# Patient Record
Sex: Female | Born: 1970 | Race: Black or African American | Hispanic: No | Marital: Married | Smoking: Current every day smoker
Health system: Southern US, Community
[De-identification: ages and names within clinical notes are randomized; demographics above are authoritative.]

## PROBLEM LIST (undated history)

## (undated) DIAGNOSIS — J45909 Unspecified asthma, uncomplicated: Secondary | ICD-10-CM

## (undated) DIAGNOSIS — L309 Dermatitis, unspecified: Secondary | ICD-10-CM

## (undated) HISTORY — PX: TUBAL LIGATION: SHX77

## (undated) HISTORY — PX: INJECTION, BULKING AGENT, URETHRA: SHX7315

---

## 2000-06-16 ENCOUNTER — Emergency Department (HOSPITAL_COMMUNITY): Admission: EM | Admit: 2000-06-16 | Discharge: 2000-06-16 | Payer: Self-pay | Admitting: Emergency Medicine

## 2003-03-24 ENCOUNTER — Emergency Department (HOSPITAL_COMMUNITY): Admission: EM | Admit: 2003-03-24 | Discharge: 2003-03-24 | Payer: Self-pay | Admitting: Emergency Medicine

## 2004-07-18 ENCOUNTER — Inpatient Hospital Stay (HOSPITAL_COMMUNITY): Admission: AD | Admit: 2004-07-18 | Discharge: 2004-07-21 | Payer: Self-pay | Admitting: Obstetrics

## 2004-07-19 ENCOUNTER — Encounter (INDEPENDENT_AMBULATORY_CARE_PROVIDER_SITE_OTHER): Payer: Self-pay | Admitting: Specialist

## 2004-07-25 ENCOUNTER — Inpatient Hospital Stay (HOSPITAL_COMMUNITY): Admission: AD | Admit: 2004-07-25 | Discharge: 2004-07-28 | Payer: Self-pay | Admitting: Obstetrics

## 2004-09-26 ENCOUNTER — Ambulatory Visit (HOSPITAL_BASED_OUTPATIENT_CLINIC_OR_DEPARTMENT_OTHER): Admission: RE | Admit: 2004-09-26 | Discharge: 2004-09-26 | Payer: Self-pay | Admitting: Cardiology

## 2006-08-10 ENCOUNTER — Emergency Department (HOSPITAL_COMMUNITY): Admission: EM | Admit: 2006-08-10 | Discharge: 2006-08-10 | Payer: Self-pay | Admitting: Emergency Medicine

## 2008-03-27 ENCOUNTER — Emergency Department (HOSPITAL_COMMUNITY): Admission: EM | Admit: 2008-03-27 | Discharge: 2008-03-27 | Payer: Self-pay | Admitting: Family Medicine

## 2010-07-01 ENCOUNTER — Ambulatory Visit (HOSPITAL_COMMUNITY): Admission: RE | Admit: 2010-07-01 | Discharge: 2010-07-01 | Payer: Self-pay | Admitting: Obstetrics

## 2011-02-24 NOTE — Discharge Summary (Signed)
Kara Ramsey, Kara Ramsey                 ACCOUNT NO.:  [id number]   MEDICAL RECORD NO.:  [id number]          PATIENT TYPE:  INP   LOCATION:  9110                          FACILITY:  [facility]   PHYSICIAN:  Kathreen Cosier, M.D.DATE OF BIRTH:  30-Jan-1971   DATE OF ADMISSION:  07/25/2004  DATE OF DISCHARGE:  07/28/2004                                 DISCHARGE SUMMARY   HOSPITAL COURSE:  The patient is a 40 year old gravida 5 para 3-0-2-3 with a  normal vaginal delivery on July 19, 2004 and tubal ligation.  The patient  had no history of increased blood pressures.  However, when a visiting nurse  went to her house on the day of admission her blood pressure was 200/105.  She had a 2-day history of headache.  On admission, her hemoglobin was 15.3,  platelets 251, sodium 136, potassium 3.8, chloride 102, creatinine 0.6, uric  acid was 5.6.  The patient was started on magnesium sulfate, 4 g loading, 2  g per hour.  She also had 2+ edema.  By October 18, blood pressure ranged  between 138/80 to 156/100.  On October 19, diastolics 100 to 111, uric acid  5.9, liver enzymes normal.  Magnesium sulfate was discontinued and she was  started on labetalol 200 mg p.o. b.i.d.  She was also on hydrochlorothiazide  50 mg p.o. daily and Norvasc 10 mg daily.  Her blood pressures subsequently  normalized and she was discharged home on October 20 on hydrochlorothiazide  50 mg p.o. daily, labetalol 200 mg p.o. b.i.d., Norvasc 10 mg p.o. daily, to  see me on Monday after discharge at 10 a.m. for blood pressure check.   DISCHARGE DIAGNOSIS:  Delayed preeclampsia postpartum.  To see me in 3 days  for blood pressure check.      BAM/MEDQ  D:  08/24/2004  T:  08/24/2004  Job:  [id number]

## 2011-02-24 NOTE — Discharge Summary (Signed)
NAMEHARUNA, Kara Ramsey                 ACCOUNT NO.:  [id number]   MEDICAL RECORD NO.:  [id number]          PATIENT TYPE:  INP   LOCATION:  9124                          FACILITY:  [facility]   PHYSICIAN:  Kathreen Cosier, M.D.DATE OF BIRTH:  11-13-1970   DATE OF ADMISSION:  07/18/2004  DATE OF DISCHARGE:                                 DISCHARGE SUMMARY   The patient is a 40 year old, gravida 7, para 2-0-4-2, [facility] July 30, 2004.  She was admitted in labor, she has a positive GBS, she got penicillin on  admission.  On admission, her cervix was 4 cm, 90%, vertex zero station. She  progressed satisfactorily in labor and had a normal vaginal delivery of a  female Apgar 9 & 9, no episiotomy or laceration, baby weighed 7 pounds, 2  ounces.  The patient desired sterilization and on October 11 underwent  postpartum tubal ligation. She did well and was discharged on the second  postpartum day ambulatory on a regular diet on Tylenol #3 to see me in six  weeks.   LABORATORY DATA:  On admission hemoglobin 13.5, platelets 220, white count  8.2 and postpartum hemoglobin 12.7, platelets 185.   DISCHARGE DIAGNOSES:  Status post normal vaginal delivery at term and  postpartum tubal ligation.      BAM/MEDQ  D:  07/21/2004  T:  07/21/2004  Job:  [id number]

## 2011-02-24 NOTE — Procedures (Signed)
NAMESABIRIN, Kara Ramsey                 ACCOUNT NO.:  [id number]   MEDICAL RECORD NO.:  [id number]          PATIENT TYPE:  OUT   LOCATION:  SLEEP CENTER                 FACILITY:  [facility]   PHYSICIAN:  Clinton D. Maple Hudson, M.D. DATE OF BIRTH:  14-Jun-1971   DATE OF STUDY:  09/26/2004                              NOCTURNAL POLYSOMNOGRAM   STUDY DATE:  September 26, 2004   REFERRING PHYSICIAN:  Osvaldo Shipper. Spruill, M.D.   INDICATION FOR STUDY:  Insomnia with sleep apnea.   EPWORTH SLEEPINESS SCORE:  15/24   NECK SIZE:  15 inches   BODY MASS INDEX:  30.4   WEIGHT:  183 pounds   MEDICATIONS:  Home medications include Zoloft and Ambien which may affect  sleep.   SLEEP ARCHITECTURE:  Total sleep time 383 minutes with sleep efficiency 89%.  Stage I was 4%, stage II 64%, stages III and IV were absent, REM was 31% of  total sleep time.  Sleep latency 40 minutes, REM latency 56 minutes, awake  after sleep onset 9.5 minutes, arousal index 30.   RESPIRATORY DATA:  RDI 4.4/hr which is within the upper limits of normal for  frequency of obstructive events and does not indicate sleep-disordered  breathing.  There was one obstructive apnea and 27 hypopneas.  Events were  not positional.  REM RDI was 1/hr.   OXYGEN DATA:  Moderate to loud snoring with oxygen desaturation to a nadir  of 87% with events.  Mean oxygen saturation through the study was 91-93% on  room air.   CARDIAC DATA:  Normal sinus rhythm with occasional PVC and PAC.   MOVEMENT/PARASOMNIA:  A total of 65 limb jerks were recorded of which 7 were  associated with arousal or awakening for a periodic limb movement with  arousal index of 1.1/hr which is unremarkable.   IMPRESSION/RECOMMENDATION:  Occasional obstructive respiratory events were  noted but they did not meet the diagnostic criteria to be labeled as sleep-  disordered breathing with an respiratory disturbance index of 4.4/hr.  There was mild oxygen desaturation to 87% briefly  with events.  Best therapy  may be directed at her insomnia complaint with attention to weight loss and  good sleep hygiene.                                                           Clinton D. Maple Hudson, M.D.  Diplomate, [employer] Board   CDY/MEDQ  D:  10/02/2004 12:50:31  T:  10/02/2004 21:51:22  Job:  [id number]

## 2011-02-24 NOTE — Op Note (Signed)
NAMELAUREN, Kara Ramsey                 ACCOUNT NO.:  [id number]   MEDICAL RECORD NO.:  [id number]          PATIENT TYPE:  INP   LOCATION:  9124                          FACILITY:  [facility]   PHYSICIAN:  Kathreen Cosier, M.D.DATE OF BIRTH:  03/02/1971   DATE OF PROCEDURE:  07/19/2004  DATE OF DISCHARGE:                                 OPERATIVE REPORT   PREOPERATIVE DIAGNOSES:  Multiparity.   POSTOPERATIVE DIAGNOSES:  Multiparity.   PROCEDURE:  Postpartum tubal ligation.   Using epidural, patient in supine position, abdomen prepped and draped,  bladder emptied with a straight catheter. A midline subumbilical incision  one inch long was made, carried down to the fascia. The fascia cleaned, the  fascia and the peritoneum opened with Mayo scissors.  The left tube brought  out to the mid portion with a Babcock clamp. The tube traced to the fimbria.  A #0 plain suture placed in the mesosalpinx with lower portion of tube in  the clamp. This was tied and approximately 1 inch of tube transected.  Hemostasis satisfactory. The procedure done in the exact fashion on the  other side. Lap and sponge counts correct, abdomen closed in layers.  Peritoneum and fascia continuous suture of #0 Dexon and the skin closed with  subcuticular stitch of 4-0 Monocryl. Blood loss less than 5 mL.  The patient  tolerated the procedure well and taken to the recovery room in good  condition.      BAM/MEDQ  D:  07/19/2004  T:  07/19/2004  Job:  [id number]

## 2012-03-04 ENCOUNTER — Emergency Department (HOSPITAL_COMMUNITY)
Admission: EM | Admit: 2012-03-04 | Discharge: 2012-03-04 | Disposition: A | Discharge status: Home / Self Care | Payer: Self-pay | Attending: Emergency Medicine | Admitting: Emergency Medicine

## 2012-03-04 ENCOUNTER — Emergency Department (HOSPITAL_COMMUNITY): Payer: Self-pay

## 2012-03-04 ENCOUNTER — Encounter (HOSPITAL_COMMUNITY): Payer: Self-pay | Admitting: *Deleted

## 2012-03-04 DIAGNOSIS — R059 Cough, unspecified: Secondary | ICD-10-CM | POA: Insufficient documentation

## 2012-03-04 DIAGNOSIS — R05 Cough: Secondary | ICD-10-CM

## 2012-03-04 DIAGNOSIS — R6883 Chills (without fever): Secondary | ICD-10-CM | POA: Insufficient documentation

## 2012-03-04 DIAGNOSIS — IMO0001 Reserved for inherently not codable concepts without codable children: Secondary | ICD-10-CM | POA: Insufficient documentation

## 2012-03-04 DIAGNOSIS — R0602 Shortness of breath: Secondary | ICD-10-CM | POA: Insufficient documentation

## 2012-03-04 DIAGNOSIS — F17200 Nicotine dependence, unspecified, uncomplicated: Secondary | ICD-10-CM | POA: Insufficient documentation

## 2012-03-04 DIAGNOSIS — Z79899 Other long term (current) drug therapy: Secondary | ICD-10-CM | POA: Insufficient documentation

## 2012-03-04 DIAGNOSIS — H9209 Otalgia, unspecified ear: Secondary | ICD-10-CM | POA: Insufficient documentation

## 2012-03-04 DIAGNOSIS — J069 Acute upper respiratory infection, unspecified: Secondary | ICD-10-CM | POA: Insufficient documentation

## 2012-03-04 MED ORDER — BENZONATATE 100 MG PO CAPS: 100.0000 mg | ORAL_CAPSULE | Freq: Three times a day (TID) | ORAL | Status: AC

## 2012-03-04 MED ORDER — HYDROCOD POLST-CHLORPHEN POLST 10-8 MG/5ML PO LQCR: 5.0000 mL | Freq: Two times a day (BID) | ORAL | Status: DC

## 2012-03-04 NOTE — Discharge Instructions (Signed)
Your chest x-ray did not show any signs for concerning infection or pneumonia. At this time he your providers feel your symptoms cause from viral infection which should improve on its own in the next several days. You were given prescriptions for cough medications to help with your symptoms. Please use these to help with your symptoms. Please also followup with your primary care provider for continued evaluation and treatment. Return if you have any fever, chills, sweats or shortness of breath.   Cough, Adult  A cough is a reflex that helps clear your throat and airways. It can help heal the body or may be a reaction to an irritated airway. A cough may only last 2 or 3 weeks (acute) or may last more than 8 weeks (chronic).  CAUSES Acute cough:  Viral or bacterial infections.  Chronic cough:  Infections.   Allergies.   Asthma.   Post-nasal drip.   Smoking.   Heartburn or acid reflux.   Some medicines.   Chronic lung problems (COPD).   Cancer.  SYMPTOMS   Cough.   Fever.   Chest pain.   Increased breathing rate.   High-pitched whistling sound when breathing (wheezing).   Colored mucus that you cough up (sputum).  TREATMENT   A bacterial cough may be treated with antibiotic medicine.   A viral cough must run its course and will not respond to antibiotics.   Your caregiver may recommend other treatments if you have a chronic cough.  HOME CARE INSTRUCTIONS   Only take over-the-counter or prescription medicines for pain, discomfort, or fever as directed by your caregiver. Use cough suppressants only as directed by your caregiver.   Use a cold steam vaporizer or humidifier in your bedroom or home to help loosen secretions.   Sleep in a semi-upright position if your cough is worse at night.   Rest as needed.   Stop smoking if you smoke.  SEEK IMMEDIATE MEDICAL CARE IF:   You have pus in your sputum.   Your cough starts to worsen.   You cannot control your  cough with suppressants and are losing sleep.   You begin coughing up blood.   You have difficulty breathing.   You develop pain which is getting worse or is uncontrolled with medicine.   You have a fever.  MAKE SURE YOU:   Understand these instructions.   Will watch your condition.   Will get help right away if you are not doing well or get worse.  Document Released: 03/24/2011 Document Revised: 09/14/2011 Document Reviewed: 03/24/2011 [facility] Patient Information 2012 [place], [place].

## 2012-03-04 NOTE — ED Provider Notes (Signed)
History     CSN: [id number]  Arrival date & time 03/04/12  0419   First MD Initiated Contact with Patient 03/04/12 [phone]      Chief Complaint  Patient presents with  . Cough  . Chills    HPI  History provided by the patient. Patient is a 41 year old female with no significant past medical history who presents with complaints of persistent productive cough, generalized fatigue and body aches with occasional chills. Patient reports sputum is yellow to sometimes greenish color. She has had occasional shortness of breath with her cough symptoms. Patient reports the coughing is so severe that it is difficult to sleep. She has tried over-the-counter cough medications without significant improvement. She denies any other aggravating or alleviating factors. Patient denies any associated fever, nausea, vomiting or diarrhea. Patient has no prior history of asthma. Patient is a current smoker.    Past Medical History  Diagnosis Date  . Arthritis     History reviewed. No pertinent past surgical history.  History reviewed. No pertinent family history.  History  Substance Use Topics  . Smoking status: Current Everyday Smoker  . Smokeless tobacco: Not on file  . Alcohol Use: Yes    OB History    Grav Para Term Preterm Abortions TAB SAB Ect Mult Living                  Review of Systems  Constitutional: Positive for chills. Negative for fever.  HENT: Positive for ear pain. Negative for sore throat.   Respiratory: Positive for cough and shortness of breath.   Cardiovascular: Negative for chest pain.  Gastrointestinal: Negative for nausea, vomiting, abdominal pain and diarrhea.  Neurological: Positive for headaches.    Allergies  Review of patient's allergies indicates no known allergies.  Home Medications   Current Outpatient Rx  Name Route Sig Dispense Refill  . GUAIFENESIN ER 600 MG PO TB12 Oral Take 1,200 mg by mouth 2 (two) times daily as needed. For cough/congestion       BP 150/86  Pulse 79  Temp(Src) 97.8 F (36.6 C) (Oral)  Resp 16  SpO2 97%  Physical Exam  Nursing note and vitals reviewed. Constitutional: She is oriented to person, place, and time. She appears well-developed and well-nourished. No distress.  HENT:  Head: Normocephalic and atraumatic.  Mouth/Throat: Oropharynx is clear and moist.  Cardiovascular: Normal rate and regular rhythm.   Pulmonary/Chest: Effort normal and breath sounds normal. No respiratory distress. She has no wheezes. She has no rales.       Coughing present  Abdominal: Soft. She exhibits no distension. There is no tenderness.  Neurological: She is alert and oriented to person, place, and time.  Skin: Skin is warm and dry. No rash noted.  Psychiatric: She has a normal mood and affect. Her behavior is normal.    ED Course  Procedures   Dg Chest 2 View  03/04/2012  *RADIOLOGY REPORT*  Clinical Data: Shortness of breath, cough, chills, chest pain. Smoker.  CHEST - 2 VIEW  Comparison: None.  Findings: The heart size and pulmonary vascularity are normal. The lungs appear clear and expanded without focal air space disease or consolidation. No blunting of the costophrenic angles.  No pneumothorax.  IMPRESSION: No evidence of active pulmonary disease.  Original Report Authenticated By: Marlon Pel, M.D.     1. URI (upper respiratory infection)   2. Cough       MDM  Patient seen and evaluated. Patient in no  acute distress.        Angus Seller, [place] 03/04/12 2312

## 2012-03-04 NOTE — ED Notes (Signed)
Pt states she felt fine yesterday. Pt states that she started coughing around 22:00 last night and then had a HA, chills and ear ache. Pt states she has a productive cough.

## 2012-03-04 NOTE — ED Notes (Signed)
Rx x 2, pt voiced understanding to f/u with PCP and return for worsening conditions

## 2012-03-05 NOTE — ED Provider Notes (Signed)
Medical screening examination/treatment/procedure(s) were performed by non-physician practitioner and as supervising physician I was immediately available for consultation/collaboration.   Kayti Poss, MD 03/05/12 0414 

## 2012-10-25 ENCOUNTER — Emergency Department (INDEPENDENT_AMBULATORY_CARE_PROVIDER_SITE_OTHER)
Admission: EM | Admit: 2012-10-25 | Discharge: 2012-10-25 | Disposition: A | Discharge status: Home / Self Care | Payer: Self-pay | Source: Home / Self Care | Attending: Family Medicine | Admitting: Family Medicine

## 2012-10-25 ENCOUNTER — Encounter (HOSPITAL_COMMUNITY): Payer: Self-pay | Admitting: *Deleted

## 2012-10-25 DIAGNOSIS — L259 Unspecified contact dermatitis, unspecified cause: Secondary | ICD-10-CM

## 2012-10-25 MED ORDER — FLUTICASONE PROPIONATE 0.005 % EX OINT: TOPICAL_OINTMENT | Freq: Two times a day (BID) | CUTANEOUS | Status: DC

## 2012-10-25 NOTE — ED Notes (Signed)
Pt reports rash on both hands /fingers for over the past year - has periods of getting better/worse without reason or agitation

## 2012-10-25 NOTE — ED Provider Notes (Signed)
History     CSN: [id number]  Arrival date & time 10/25/12  1002   First MD Initiated Contact with Patient 10/25/12 1010      Chief Complaint  Patient presents with  . Rash    (Consider location/radiation/quality/duration/timing/severity/associated sxs/prior treatment) Patient is a 42 y.o. female presenting with rash. The history is provided by the patient.  Rash  This is a chronic problem. The current episode started more than 1 week ago (for 1 yr at least, onset related to wedding ring.). The problem has been gradually worsening. The problem is associated with an unknown factor. There has been no fever. The patient is experiencing no pain. Associated symptoms include itching. Pertinent negatives include no blisters and no pain.    Past Medical History  Diagnosis Date  . Arthritis     History reviewed. No pertinent past surgical history.  Family History  Problem Relation Age of Onset  . Family history unknown: Yes    History  Substance Use Topics  . Smoking status: Current Every Day Smoker  . Smokeless tobacco: Not on file  . Alcohol Use: Yes    OB History    Grav Para Term Preterm Abortions TAB SAB Ect Mult Living                  Review of Systems  Constitutional: Negative.   Skin: Positive for itching and rash.    Allergies  Review of patient's allergies indicates no known allergies.  Home Medications   Current Outpatient Rx  Name  Route  Sig  Dispense  Refill  . HYDROCOD POLST-CPM POLST ER 10-8 MG/5ML PO LQCR   Oral   Take 5 mLs by mouth every 12 (twelve) hours. Take 5 mLs by mouth every 12 (twelve) hours.   140 mL   0   . FLUTICASONE PROPIONATE 0.005 % EX OINT   Topical   Apply topically 2 (two) times daily.   30 g   0   . GUAIFENESIN ER 600 MG PO TB12   Oral   Take 1,200 mg by mouth 2 (two) times daily as needed. For cough/congestion           BP 126/82  Pulse 93  Temp 98.3 F (36.8 C) (Oral)  Resp 18  SpO2 99%  LMP  10/12/2012  Physical Exam  Nursing note and vitals reviewed. Constitutional: She is oriented to person, place, and time. She appears well-developed and well-nourished.  Neurological: She is alert and oriented to person, place, and time.  Skin: Skin is warm and dry. Rash noted. No erythema.       Dry peeling finger dermatitis on lrf and right thumb.    ED Course  Procedures (including critical care time)  Labs Reviewed - No data to display No results found.   1. Contact dermatitis and eczema due to cause       MDM          Linna Hoff, MD 10/25/12 1054

## 2012-12-17 ENCOUNTER — Emergency Department (HOSPITAL_COMMUNITY)
Admission: EM | Admit: 2012-12-17 | Discharge: 2012-12-17 | Disposition: A | Discharge status: Home / Self Care | Payer: Self-pay | Attending: Emergency Medicine | Admitting: Emergency Medicine

## 2012-12-17 ENCOUNTER — Encounter (HOSPITAL_COMMUNITY): Payer: Self-pay | Admitting: Emergency Medicine

## 2012-12-17 DIAGNOSIS — F17200 Nicotine dependence, unspecified, uncomplicated: Secondary | ICD-10-CM | POA: Insufficient documentation

## 2012-12-17 DIAGNOSIS — J45909 Unspecified asthma, uncomplicated: Secondary | ICD-10-CM | POA: Insufficient documentation

## 2012-12-17 DIAGNOSIS — L301 Dyshidrosis [pompholyx]: Secondary | ICD-10-CM | POA: Insufficient documentation

## 2012-12-17 HISTORY — DX: Unspecified asthma, uncomplicated: J45.909

## 2012-12-17 MED ORDER — PREDNISONE 10 MG PO TABS: ORAL_TABLET | ORAL | Status: DC

## 2012-12-17 MED ORDER — TRAMADOL HCL 50 MG PO TABS: 50.0000 mg | ORAL_TABLET | Freq: Four times a day (QID) | ORAL | Status: DC | PRN

## 2012-12-17 NOTE — ED Notes (Signed)
Pt from home, c/o persistent bilateral rash on fingers. Was seen at urgent care 1 month ago for same.

## 2012-12-17 NOTE — ED Provider Notes (Signed)
Medical screening examination/treatment/procedure(s) were performed by non-physician practitioner and as supervising physician I was immediately available for consultation/collaboration.   Alan Davidson III, MD 12/17/12 1948 

## 2012-12-17 NOTE — ED Provider Notes (Signed)
History     CSN: [id number]  Arrival date & time 12/17/12  4132   First MD Initiated Contact with Patient 12/17/12 [phone]      Chief Complaint  Patient presents with  . Rash    (Consider location/radiation/quality/duration/timing/severity/associated sxs/prior treatment) Patient is a 42 y.o. female presenting with rash. The history is provided by the patient. No language interpreter was used.  Rash Location:  Hand Hand rash location:  L hand and R hand Quality: blistering and painful   Associated symptoms: no fever   Associated symptoms comment:  Rash that has been recurrent on both hands, including fingers and palms/dorsum, for the past year. It becomes painful. No drainage. No recognized pattern.    Past Medical History  Diagnosis Date  . Asthma     Past Surgical History  Procedure Laterality Date  . Tubal ligation      History reviewed. No pertinent family history.  History  Substance Use Topics  . Smoking status: Current Every Day Smoker  . Smokeless tobacco: Not on file  . Alcohol Use: Yes    OB History   Grav Para Term Preterm Abortions TAB SAB Ect Mult Living                  Review of Systems  Constitutional: Negative for fever.  Skin: Positive for rash.    Allergies  Review of patient's allergies indicates no known allergies.  Home Medications   Current Outpatient Rx  Name  Route  Sig  Dispense  Refill  . fluticasone (CUTIVATE) 0.005 % ointment   Topical   Apply topically 2 (two) times daily.   30 g   0   . hydrocortisone cream 0.5 %   Topical   Apply 1 application topically 3 (three) times daily as needed (rash).           BP 152/118  Pulse 112  Temp(Src) 98.3 F (36.8 C) (Oral)  Resp 18  SpO2 98%  Physical Exam  Constitutional: She is oriented to person, place, and time. She appears well-developed and well-nourished. No distress.  Musculoskeletal: Normal range of motion. She exhibits no edema and no tenderness.   Neurological: She is alert and oriented to person, place, and time.  Skin:  Papulovesicular rash to dorsal and palmar fingers of both hands and palms. No redness, or purulence. No swelling. Fingernails normal in appearance - no evidence of fungal invasion.    ED Course  Procedures (including critical care time)  Labs Reviewed - No data to display No results found.   No diagnosis found.  1. 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 She has tried topical steroids without improvement. Has a dermatologist appointment in place for later this month. Will give tape dose of oral steroids, supportive measures and encourage follow up.        Arnoldo Hooker, PA-C 12/17/12 [phone]

## 2013-03-01 ENCOUNTER — Emergency Department (HOSPITAL_COMMUNITY): Payer: Self-pay

## 2013-03-01 ENCOUNTER — Emergency Department (HOSPITAL_COMMUNITY)
Admission: EM | Admit: 2013-03-01 | Discharge: 2013-03-01 | Disposition: A | Discharge status: Home / Self Care | Payer: Self-pay | Attending: Emergency Medicine | Admitting: Emergency Medicine

## 2013-03-01 ENCOUNTER — Encounter (HOSPITAL_COMMUNITY): Payer: Self-pay | Admitting: Emergency Medicine

## 2013-03-01 DIAGNOSIS — N949 Unspecified condition associated with female genital organs and menstrual cycle: Secondary | ICD-10-CM | POA: Insufficient documentation

## 2013-03-01 DIAGNOSIS — M549 Dorsalgia, unspecified: Secondary | ICD-10-CM | POA: Insufficient documentation

## 2013-03-01 DIAGNOSIS — N898 Other specified noninflammatory disorders of vagina: Secondary | ICD-10-CM | POA: Insufficient documentation

## 2013-03-01 DIAGNOSIS — Z3202 Encounter for pregnancy test, result negative: Secondary | ICD-10-CM | POA: Insufficient documentation

## 2013-03-01 DIAGNOSIS — D259 Leiomyoma of uterus, unspecified: Secondary | ICD-10-CM | POA: Insufficient documentation

## 2013-03-01 DIAGNOSIS — N83202 Unspecified ovarian cyst, left side: Secondary | ICD-10-CM

## 2013-03-01 DIAGNOSIS — N938 Other specified abnormal uterine and vaginal bleeding: Secondary | ICD-10-CM | POA: Insufficient documentation

## 2013-03-01 DIAGNOSIS — N760 Acute vaginitis: Secondary | ICD-10-CM | POA: Insufficient documentation

## 2013-03-01 DIAGNOSIS — N83209 Unspecified ovarian cyst, unspecified side: Secondary | ICD-10-CM | POA: Insufficient documentation

## 2013-03-01 DIAGNOSIS — Z9851 Tubal ligation status: Secondary | ICD-10-CM | POA: Insufficient documentation

## 2013-03-01 DIAGNOSIS — F17200 Nicotine dependence, unspecified, uncomplicated: Secondary | ICD-10-CM | POA: Insufficient documentation

## 2013-03-01 DIAGNOSIS — J45909 Unspecified asthma, uncomplicated: Secondary | ICD-10-CM | POA: Insufficient documentation

## 2013-03-01 LAB — URINALYSIS, ROUTINE W REFLEX MICROSCOPIC
Bilirubin Urine: NEGATIVE
Glucose, UA: NEGATIVE mg/dL
Ketones, ur: NEGATIVE mg/dL
Protein, ur: 100 mg/dL — AB
Urobilinogen, UA: 1 mg/dL (ref 0.0–1.0)

## 2013-03-01 LAB — CBC WITH DIFFERENTIAL/PLATELET
Basophils Absolute: 0 10*3/uL (ref 0.0–0.1)
Eosinophils Absolute: 0.2 10*3/uL (ref 0.0–0.7)
Eosinophils Relative: 2 % (ref 0–5)
HCT: 44.4 % (ref 36.0–46.0)
Lymphocytes Relative: 31 % (ref 12–46)
MCH: 30.6 pg (ref 26.0–34.0)
MCHC: 35.4 g/dL (ref 30.0–36.0)
MCV: 86.5 fL (ref 78.0–100.0)
Monocytes Absolute: 0.5 10*3/uL (ref 0.1–1.0)
Platelets: 226 10*3/uL (ref 150–400)
RDW: 13 % (ref 11.5–15.5)

## 2013-03-01 LAB — COMPREHENSIVE METABOLIC PANEL
ALT: 16 U/L (ref 0–35)
AST: 14 U/L (ref 0–37)
CO2: 24 mEq/L (ref 19–32)
Calcium: 9.4 mg/dL (ref 8.4–10.5)
Creatinine, Ser: 0.83 mg/dL (ref 0.50–1.10)
GFR calc Af Amer: >90 mL/min (ref >90)
GFR calc non Af Amer: 87 mL/min — ABNORMAL LOW (ref >90)
Glucose, Bld: 90 mg/dL (ref 70–99)
Sodium: 136 mEq/L (ref 135–145)
Total Protein: 7.5 g/dL (ref 6.0–8.3)

## 2013-03-01 LAB — WET PREP, GENITAL
Trich, Wet Prep: NONE SEEN
Yeast Wet Prep HPF POC: NONE SEEN

## 2013-03-01 LAB — URINE MICROSCOPIC-ADD ON

## 2013-03-01 LAB — POCT PREGNANCY, URINE: Preg Test, Ur: NEGATIVE

## 2013-03-01 MED ORDER — METRONIDAZOLE 500 MG PO TABS: 500.0000 mg | ORAL_TABLET | Freq: Two times a day (BID) | ORAL | Status: DC

## 2013-03-01 MED ORDER — IBUPROFEN 800 MG PO TABS
800.0000 mg | ORAL_TABLET | Freq: Once | ORAL | Status: AC
Start: 2013-03-01 — End: 2013-03-01
  Administered 2013-03-01: 800 mg via ORAL
  Filled 2013-03-01: qty 1

## 2013-03-01 NOTE — ED Provider Notes (Signed)
History     CSN: [id number]  Arrival date & time 03/01/13  4098   First MD Initiated Contact with Patient 03/01/13 [phone]      Chief Complaint  Patient presents with  . Abdominal Pain  . Abdominal Cramping    (Consider location/radiation/quality/duration/timing/severity/associated sxs/prior treatment) HPI Comments: Kara Ramsey is a 42 y/o F with PMhx of asthma presenting to the ED with prolonged menstruation and abdominal cramping. Patient reported that her LMP was 02/14/2013 - stated that she was a little late this month, normal cycles begin around the 4th-5th of each month, normally lasting approximately 5-6 days. Patient stated that she has been bleeding off and on since 02/14/2013. Stated that after the normal 5-6 days of her cycle she noticed brownish blood spotting, thinking that the period was ending, but then this past Wednesday/Thursday she noticed that she was bleeding bright red blood again - went through one pad - then stated that the bleeding went back to the normal brownish blood spotting yesterday. Patient stated that she was awoken by abdominal cramping this morning at 4:00-5:00AM. Stated that the abdominal cramping began on the left side and then spread to the lower abdomen. Patient described the discomfort to be a constant cramping sensation with radiation to the lower back. Stated that her discomfort is a 5/10. Reported that she feels bloated. Patient stated that she has been experiencing breast tenderness that has been ongoing since yesterday - patient reported that these are all normal symptoms that she has when she is ovulating and when she is in the process of menstruating. Patient concerned regarding the length of bleeding and cramping that she is experiencing. Denied fever, chills, sweating, chest pain, shortness of breath, difficulty breathing, n/v/d, melena, hematochezia, weakness, changes to appetite, visual changes, urinary symptoms, vaginal discharge, vaginal pain,  dyspareunia.  [id number] - tubal ligation performed in 2005. Patient is sexually active with husband - last encounter was approximately 3 weeks ago as per patient - denied protection.  GYN: Dr. Trudie Buckler - has not seen GYN in about a year.   Patient is a 42 y.o. female presenting with abdominal pain and cramps. The history is provided by the patient. No language interpreter was used.  Abdominal Pain Pertinent negatives include no abdominal pain, arthralgias, chills, congestion, coughing, fatigue, fever, headaches, nausea, neck pain, numbness, rash, sore throat, vomiting or weakness.  Abdominal Cramping Pertinent negatives include no abdominal pain, arthralgias, chills, congestion, coughing, fatigue, fever, headaches, nausea, neck pain, numbness, rash, sore throat, vomiting or weakness.    Past Medical History  Diagnosis Date  . Asthma     Past Surgical History  Procedure Laterality Date  . Tubal ligation      History reviewed. No pertinent family history.  History  Substance Use Topics  . Smoking status: Current Every Day Smoker  . Smokeless tobacco: Not on file  . Alcohol Use: Yes    OB History   Grav Para Term Preterm Abortions TAB SAB Ect Mult Living                  Review of Systems  Constitutional: Negative for fever, chills and fatigue.  HENT: Negative for ear pain, congestion, sore throat, rhinorrhea, trouble swallowing, neck pain, neck stiffness and tinnitus.   Eyes: Negative for photophobia, pain and visual disturbance.  Respiratory: Negative for cough, chest tightness and shortness of breath.   Gastrointestinal: Negative for nausea, vomiting, abdominal pain, diarrhea, constipation and blood in stool.  Genitourinary: Positive for vaginal bleeding  and pelvic pain. Negative for dysuria, urgency, hematuria, flank pain, decreased urine volume, vaginal discharge, difficulty urinating, vaginal pain and dyspareunia.  Musculoskeletal: Positive for back pain (cramping sensation  to the back ). Negative for arthralgias.  Skin: Negative for rash and wound.  Neurological: Negative for dizziness, weakness, light-headedness, numbness and headaches.  All other systems reviewed and are negative.    Allergies  Review of patient's allergies indicates no known allergies.  Home Medications   Current Outpatient Rx  Name  Route  Sig  Dispense  Refill  . metroNIDAZOLE (FLAGYL) 500 MG tablet   Oral   Take 1 tablet (500 mg total) by mouth 2 (two) times daily. One po bid x 7 days   14 tablet   0     BP 133/95  Pulse 85  Temp(Src) 98.5 F (36.9 C) (Oral)  Resp 15  SpO2 95%  LMP 03/01/2013  Physical Exam  Nursing note and vitals reviewed. Constitutional: She is oriented to person, place, and time. She appears well-developed and well-nourished. No distress.  HENT:  Head: Normocephalic and atraumatic.  Mouth/Throat: Oropharynx is clear and moist. No oropharyngeal exudate.  Eyes: Conjunctivae and EOM are normal. Pupils are equal, round, and reactive to light. Right eye exhibits no discharge. Left eye exhibits no discharge.  Neck: Normal range of motion. Neck supple. No tracheal deviation present. No thyromegaly present.  Negative neck stiffness Negative nuchal rigidity Negative lymphadenopathy  Cardiovascular: Normal rate, regular rhythm and normal heart sounds.  Exam reveals no friction rub.   No murmur heard. Pulses:      Radial pulses are 2+ on the right side, and 2+ on the left side.       Dorsalis pedis pulses are 2+ on the right side, and 2+ on the left side.  Pulmonary/Chest: Effort normal and breath sounds normal. No respiratory distress. She has no wheezes. She has no rales.  Abdominal: Soft. Normal appearance and bowel sounds are normal. She exhibits no distension and no mass. There is tenderness (Mainly pelvic discomfort - described as a "cramping" when pressure applied). There is no rebound, no guarding, no tenderness at McBurney's point and negative  Murphy's sign.    Genitourinary: Vagina normal and uterus normal. No vaginal discharge found.  Speculum: Negative sores, lesions, inflammation, swelling, infection noted to the external genitalia. Patient has a piercing to the hood of the clitoris. Bright red vaginal bleeding noted to the vaginal vault. Negative lesions, sores, masses noted to walls of vaginal canal. Negative strawberry cervix. Positive bright red blood from cervical os. Negative lesions, sores, inflammation, plaques noted to the cervix.  Pelvic: Negative masses, lesions, sores felt upon palpation. Uterus anteroverted. Negative cervical motion tenderness. Negative pain upon palpation to the pelvic, just pressure felt as reported by patient.   Lymphadenopathy:    She has no cervical adenopathy.  Neurological: She is alert and oriented to person, place, and time. No cranial nerve deficit. She exhibits normal muscle tone. Coordination normal.  Skin: Skin is warm and dry. No rash noted. She is not diaphoretic. No erythema.  Psychiatric: She has a normal mood and affect. Her behavior is normal. Thought content normal.    ED Course  Procedures (including critical care time)  10:21Am Discussed lab findings with patient. Patient reported abdominal cramping - Ibuprofen 800 mg PO ordered   Labs Reviewed  WET PREP, GENITAL - Abnormal; Notable for the following:    Clue Cells Wet Prep HPF POC MODERATE (*)    WBC,  Wet Prep HPF POC FEW (*)    All other components within normal limits  URINALYSIS, ROUTINE W REFLEX MICROSCOPIC - Abnormal; Notable for the following:    Color, Urine AMBER (*)    APPearance TURBID (*)    Hgb urine dipstick LARGE (*)    Protein, ur 100 (*)    Leukocytes, UA SMALL (*)    All other components within normal limits  CBC WITH DIFFERENTIAL - Abnormal; Notable for the following:    RBC 5.13 (*)    Hemoglobin 15.7 (*)    All other components within normal limits  COMPREHENSIVE METABOLIC PANEL - Abnormal;  Notable for the following:    Alkaline Phosphatase 38 (*)    GFR calc non Af Amer 87 (*)    All other components within normal limits  URINE MICROSCOPIC-ADD ON - Abnormal; Notable for the following:    Squamous Epithelial / LPF FEW (*)    Bacteria, UA MANY (*)    All other components within normal limits  GC/CHLAMYDIA PROBE AMP  LIPASE, BLOOD  POCT PREGNANCY, URINE   US Transvaginal Non-ob  03/01/2013   *RADIOLOGY REPORT*  Clinical Data: 42 year old, LMP began on 02/14/2013 but the patient has had continuous bleeding since and has pelvic pain.  Prior bilateral tubal ligation.  TRANSABDOMINAL AND TRANSVAGINAL ULTRASOUND OF PELVIS  Technique:  Both transabdominal and transvaginal ultrasound examinations of the pelvis were performed. Transabdominal technique was performed for global imaging of the pelvis including uterus, ovaries, adnexal regions, and pelvic cul-de-sac.  It was necessary to proceed with endovaginal exam following the transabdominal exam to optimally visualize the endometrium and ovaries as the bladder was incompletely distended.  Comparison:  None.  Findings:  Uterus: Upper normal in size measuring approximate 9.2 x 4.8 x 5.7 cm.  Subserosal fibroid arising from the anterior fundus measuring approximately 1.4 x 1.1 x 1.2 cm.  No other visible fibroids. Normal-appearing uterine cervix.  Endometrium: Normal and trilaminar in appearance measuring 8 mm in thickness.  No endometrial fluid or mass.  Right ovary:  Normal in size and appearance measuring approximate 2.8 x 1.7 x 1.4 cm.  Normal color Doppler flow within the ovary.  Left ovary: Upper normal in size measuring approximate 4.1 x 3.1 x 3.4 cm, containing a dominant 3.5 x 2.5 x 2.3 cm simple cyst. Normal color Doppler flow within the ovary.  Other findings: No other adnexal masses or free pelvic fluid.  IMPRESSION:  1.  No abnormalities identified to explain dysfunctional uterine bleeding.  Normal-appearing endometrium measuring 8 mm in  thickness. 2.  Approximate 1.4 cm subserosal fibroid arising from the anterior uterine fundus. 3.  Approximate 3.5 cm simple cyst arising from the left ovary. This is almost certainly benign, and no specific imaging follow up is recommended according to the Society of Radiologists in Ultrasound 2010 Consensus  Conference Statement (D Lenis Noon et al. Management of Asymptomatic Ovarian and Other Adnexal Cysts Imaged at [place]:  Society of Radiologists in Ultrasound Consensus Conference Statement 2010.  Radiology 256 (Sept 2010): 943-954).   Original Report Authenticated By: Hulan Saas, M.D.   US Pelvis Complete  03/01/2013   *RADIOLOGY REPORT*  Clinical Data: 42 year old, LMP began on 02/14/2013 but the patient has had continuous bleeding since and has pelvic pain.  Prior bilateral tubal ligation.  TRANSABDOMINAL AND TRANSVAGINAL ULTRASOUND OF PELVIS  Technique:  Both transabdominal and transvaginal ultrasound examinations of the pelvis were performed. Transabdominal technique was performed for global imaging of the pelvis including uterus, ovaries,  adnexal regions, and pelvic cul-de-sac.  It was necessary to proceed with endovaginal exam following the transabdominal exam to optimally visualize the endometrium and ovaries as the bladder was incompletely distended.  Comparison:  None.  Findings:  Uterus: Upper normal in size measuring approximate 9.2 x 4.8 x 5.7 cm.  Subserosal fibroid arising from the anterior fundus measuring approximately 1.4 x 1.1 x 1.2 cm.  No other visible fibroids. Normal-appearing uterine cervix.  Endometrium: Normal and trilaminar in appearance measuring 8 mm in thickness.  No endometrial fluid or mass.  Right ovary:  Normal in size and appearance measuring approximate 2.8 x 1.7 x 1.4 cm.  Normal color Doppler flow within the ovary.  Left ovary: Upper normal in size measuring approximate 4.1 x 3.1 x 3.4 cm, containing a dominant 3.5 x 2.5 x 2.3 cm simple cyst. Normal color Doppler flow  within the ovary.  Other findings: No other adnexal masses or free pelvic fluid.  IMPRESSION:  1.  No abnormalities identified to explain dysfunctional uterine bleeding.  Normal-appearing endometrium measuring 8 mm in thickness. 2.  Approximate 1.4 cm subserosal fibroid arising from the anterior uterine fundus. 3.  Approximate 3.5 cm simple cyst arising from the left ovary. This is almost certainly benign, and no specific imaging follow up is recommended according to the Society of Radiologists in Ultrasound 2010 Consensus  Conference Statement (D Lenis Noon et al. Management of Asymptomatic Ovarian and Other Adnexal Cysts Imaged at [place]:  Society of Radiologists in Ultrasound Consensus Conference Statement 2010.  Radiology 256 (Sept 2010): 943-954).   Original Report Authenticated By: Hulan Saas, M.D.     1. Dysfunctional uterine bleeding   2. Uterine fibroid   3. Left ovarian cyst   4. BV (bacterial vaginosis)   5. Asthma       MDM  Patient is afebrile, non-tachycardic, non-tachypneic, normotensive, adequate saturation on room air, alert and oriented  DDx: Dysmennorrhea Ovarian Cyst Uterine Fibroid STD  Wet prep - moderate clue cells noted - possible BV infection UA large amount of blood, many bacteria noted CBC Hgb lowered (15.7) - patient asymptomatic CMP negative findings Lipase within normal limits (21) Transabd/transvag US of pelvis - approximate 1.4 cm subserosal fibroid arising from anterior uterine fundus. Approximate 3.5 cm simple cyst arising from the left ovary - benign, no follow-up imaging recommended  Discussed findings with patient. Dysfunctional menstrual bleeding possibly due to anterior uterine subserosal uterine fibroid. Left ovarian cyst noted on [place]. Discussed these findings with patient and the need to follow-up. Patient being treated for BV with presence of clue cells on wet prep. Patient aseptic, non-toxic appearing, in no acute distress. Negative urine pregnancy -  less likely to think ectopic pregnancy. Pain controlled in ED setting. Discharged patient with Flagyl for BV infection - discussed course. Discussed with patient to follow-up with Dr. Trudie Buckler regarding fibriods, ovarian cyst, and the need to have a pap smear performed once every year. Discussed with patient to rest and stay hydrated. Discussed with patient to continue monitoring her symptoms and if symptoms are to worsen or change to report back to the ED. Patient agreed to plan of care, understood, all questions answered.          Raymon Mutton, PA-C 03/01/13 1743

## 2013-03-01 NOTE — ED Notes (Signed)
Pt reports she normally has her menstrual cycle 4th or 5th of each month and is always regular.  Pt reports normal cycle lasts 5 days.  Pt reports "I started this period on May 9th and I am still bleeding.  This morning I had clots."

## 2013-03-01 NOTE — ED Notes (Addendum)
Pt also reports lower back pain, "it almost felt like contractions."  Back pain lasts approximately 20 minutes at a time.  No radiation noted.

## 2013-03-01 NOTE — ED Notes (Signed)
PA at bedside.

## 2013-03-01 NOTE — ED Notes (Signed)
Pt provided with crackers.

## 2013-03-01 NOTE — ED Notes (Signed)
Patient transported to Ultrasound 

## 2013-03-01 NOTE — ED Provider Notes (Signed)
Medical screening examination/treatment/procedure(s) were performed by non-physician practitioner and as supervising physician I was immediately available for consultation/collaboration.   Carleene Cooper III, MD 03/01/13 [phone]

## 2013-03-02 LAB — GC/CHLAMYDIA PROBE AMP: CT Probe RNA: NEGATIVE

## 2015-11-30 ENCOUNTER — Encounter (HOSPITAL_COMMUNITY): Payer: Self-pay

## 2015-11-30 ENCOUNTER — Emergency Department (HOSPITAL_COMMUNITY)
Admission: EM | Admit: 2015-11-30 | Discharge: 2015-11-30 | Disposition: A | Discharge status: Home / Self Care | Payer: BLUE CROSS/BLUE SHIELD | Attending: Emergency Medicine | Admitting: Emergency Medicine

## 2015-11-30 DIAGNOSIS — Y9289 Other specified places as the place of occurrence of the external cause: Secondary | ICD-10-CM | POA: Insufficient documentation

## 2015-11-30 DIAGNOSIS — Y998 Other external cause status: Secondary | ICD-10-CM | POA: Diagnosis not present

## 2015-11-30 DIAGNOSIS — T7840XA Allergy, unspecified, initial encounter: Secondary | ICD-10-CM | POA: Insufficient documentation

## 2015-11-30 DIAGNOSIS — Y9389 Activity, other specified: Secondary | ICD-10-CM | POA: Diagnosis not present

## 2015-11-30 DIAGNOSIS — J45909 Unspecified asthma, uncomplicated: Secondary | ICD-10-CM | POA: Insufficient documentation

## 2015-11-30 DIAGNOSIS — F17200 Nicotine dependence, unspecified, uncomplicated: Secondary | ICD-10-CM | POA: Insufficient documentation

## 2015-11-30 DIAGNOSIS — R21 Rash and other nonspecific skin eruption: Secondary | ICD-10-CM | POA: Diagnosis present

## 2015-11-30 DIAGNOSIS — X58XXXA Exposure to other specified factors, initial encounter: Secondary | ICD-10-CM | POA: Insufficient documentation

## 2015-11-30 LAB — URINALYSIS, ROUTINE W REFLEX MICROSCOPIC
Glucose, UA: NEGATIVE mg/dL
KETONES UR: NEGATIVE mg/dL
NITRITE: NEGATIVE
PROTEIN: 100 mg/dL — AB
SPECIFIC GRAVITY, URINE: 1.031 — AB (ref 1.005–1.030)
pH: 5.5 (ref 5.0–8.0)

## 2015-11-30 LAB — URINE MICROSCOPIC-ADD ON

## 2015-11-30 MED ORDER — PREDNISONE 20 MG PO TABS
60.0000 mg | ORAL_TABLET | Freq: Once | ORAL | Status: AC
  Administered 2015-11-30: 60 mg via ORAL
  Filled 2015-11-30: qty 3

## 2015-11-30 MED ORDER — PREDNISONE 20 MG PO TABS: ORAL_TABLET | ORAL | Status: DC

## 2015-11-30 MED ORDER — FAMOTIDINE 20 MG PO TABS: 20.0000 mg | ORAL_TABLET | Freq: Two times a day (BID) | ORAL | Status: DC

## 2015-11-30 MED ORDER — FAMOTIDINE 20 MG PO TABS
20.0000 mg | ORAL_TABLET | Freq: Once | ORAL | Status: AC
  Administered 2015-11-30: 20 mg via ORAL
  Filled 2015-11-30: qty 1

## 2015-11-30 MED ORDER — LORATADINE 10 MG PO TABS
10.0000 mg | ORAL_TABLET | Freq: Once | ORAL | Status: AC
  Administered 2015-11-30: 10 mg via ORAL
  Filled 2015-11-30: qty 1

## 2015-11-30 NOTE — ED Notes (Addendum)
Pt c/o of redness, itching and rash experienced over face, neck and face. Pt reports using a new detergent within the last couple of days. Pt denies food allergies. Pt denies new medication use.  Pt A+OX4, speaking in complete sentences. Pt denies hx of being dx'd with HTN. Pt is hypertensive in triage 161/101 sitting.

## 2015-11-30 NOTE — Discharge Instructions (Signed)
Allergy Shots Allergy shots, also called immunotherapy, are a treatment used to help reduce allergy symptoms such as:  Sneezing.  Itchy, watery eyes.  A runny, stuffy nose.  Asthma. Allergy shots may benefit people who are allergic to:  Grass, tree, and weed pollens.  Insects.  Animal dander.  Dust mites.  Molds. HOW DO ALLERGY SHOTS WORK? Allergy shots work by exposing your body to a little bit of the substance you are allergic to (allergen) at a time. They allow your body to become familiar with the allergen and create proteins called antibodies, which block the effects of the allergen. Allergy shots begin to work shortly after you begin treatment, but allergy symptoms may not improve for 4-6 months.  HOW OFTEN AND FOR HOW LONG WILL I NEED SHOTS? Most people start by getting shots 1-3 times a week for 3-6 months, and then continue to get maintenance shots about once a month for life. Some people can stop getting shots after 3-5 years.  Allergy shots may be discontinued if:  The shots do not work for you.  You start taking certain medicines such as ACE inhibitors or beta blockers.  You miss many appointments for your shots.  You do not follow the instructions given to you by your health care provider. WHAT ARE THE SIDE EFFECTS OF ALLERGY SHOTS? The most common side effect is mild redness and swelling where the shot was given. The redness and swelling goes away on its own. Less common side effects are:  Itchy eyes, nose, or throat.  Sneezing.  Runny nose.  Itchy, red, swollen areas of skin (hives).  Trouble breathing.  Coughing.  Wheezing.  Scratchy throat.  Tightness in the chest.  Nausea.  Dizziness. After getting an allergy shot, you will need to stay at the clinic for up to 30 minutes so that a health care provider can be sure you do not have serious side effects.   This information is not intended to replace advice given to you by your health care  provider. Make sure you discuss any questions you have with your health care provider.   Document Released: 07/04/2008 Document Revised: 10/16/2014 Document Reviewed: 07/07/2014 Elsevier Interactive Patient Education 2016 [employer]  

## 2015-11-30 NOTE — ED Provider Notes (Signed)
CSN: [id number]     Arrival date & time 11/30/15  0037 History  By signing my name below, I, Altamease Oiler, attest that this documentation has been prepared under the direction and in the presence of Jaleah Lefevre, MD. Electronically Signed: Altamease Oiler, ED Scribe. 11/30/2015. 2:28 AM   Chief Complaint  Patient presents with  . Allergic Reaction  . Hypertension   Patient is a 45 y.o. female presenting with rash. The history is provided by the patient. No language interpreter was used.  Rash Location:  Head/neck and torso Torso rash location:  L chest and R chest Quality: itchiness   Severity:  Moderate Onset quality:  Gradual Duration:  1 day Timing:  Constant Progression:  Worsening Chronicity:  New Context: new detergent/soap   Relieved by:  Nothing Worsened by:  Nothing tried Ineffective treatments:  Antihistamines Associated symptoms: no fever, no nausea, no shortness of breath, no sore throat, no throat swelling, no tongue swelling and not wheezing    Kara Ramsey is a 45 y.o. female who presents to the Emergency Department complaining of a worsening pruritic rash at the face, neck, posterior scalp, and chest with onset yesterday. She associates her symptoms with recently switching to ALL detergent. Pt denies food allergies and new medication use. Benadryl, last dose approximately 4.5 hours ago, provided insufficient relief in itching at home. Pt denies difficulty breathing or swallowing.   Past Medical History  Diagnosis Date  . Asthma    Past Surgical History  Procedure Laterality Date  . Tubal ligation     History reviewed. No pertinent family history. Social History  Substance Use Topics  . Smoking status: Current Every Day Smoker  . Smokeless tobacco: None  . Alcohol Use: Yes   OB History    No data available     Review of Systems  Constitutional: Negative for fever.  HENT: Negative for sore throat and trouble swallowing.   Respiratory: Negative  for shortness of breath and wheezing.   Gastrointestinal: Negative for nausea.  Skin: Positive for rash.  All other systems reviewed and are negative.  Allergies  Review of patient's allergies indicates no known allergies.  Home Medications   Prior to Admission medications   Medication Sig Start Date End Date Taking? Authorizing Provider  metroNIDAZOLE (FLAGYL) 500 MG tablet Take 1 tablet (500 mg total) by mouth 2 (two) times daily. One po bid x 7 days 03/01/13   Marissa Sciacca, PA-C   BP 161/101 mmHg  Pulse 102  Temp(Src) 97.6 F (36.4 C) (Oral)  Resp 16  SpO2 97%  LMP 11/30/2015 (LMP Unknown) Physical Exam  Constitutional: She is oriented to person, place, and time. She appears well-developed and well-nourished.  HENT:  Head: Normocephalic and atraumatic.  Mouth/Throat: Oropharynx is clear and moist.  Moist mucous membranes No exudate No swelling of the lips, tongue, or uvula  Eyes: Pupils are equal, round, and reactive to light.  Neck: Normal range of motion. Neck supple.  Trachea midline  Cardiovascular: Normal rate, regular rhythm and intact distal pulses.   Pulmonary/Chest: Effort normal and breath sounds normal. No stridor. She has no wheezes. She has no rales.  CTAB  Abdominal: Soft. Bowel sounds are normal. She exhibits no mass. There is no tenderness. There is no rebound and no guarding.  Musculoskeletal: Normal range of motion.  Neurological: She is alert and oriented to person, place, and time. She has normal reflexes.  Skin: Skin is warm and dry. Rash noted.  Few wheels on  the upper chest. Nothing at the hairline.   Psychiatric: She has a normal mood and affect. Her behavior is normal.  Nursing note and vitals reviewed.   ED Course  Procedures (including critical care time) DIAGNOSTIC STUDIES: Oxygen Saturation is 97% on RA,  normal by my interpretation.    COORDINATION OF CARE: 2:23 AM Discussed treatment plan which includes Claritin and Pepcid with pt  at bedside and pt agreed to plan.  Labs Review Labs Reviewed  URINALYSIS, ROUTINE W REFLEX MICROSCOPIC (NOT AT [facility]) - Abnormal; Notable for the following:    Color, Urine RED (*)    APPearance CLOUDY (*)    Specific Gravity, Urine 1.031 (*)    Hgb urine dipstick LARGE (*)    Bilirubin Urine MODERATE (*)    Protein, ur 100 (*)    Leukocytes, UA SMALL (*)    All other components within normal limits  URINE MICROSCOPIC-ADD ON - Abnormal; Notable for the following:    Squamous Epithelial / LPF 6-30 (*)    Bacteria, UA MANY (*)    All other components within normal limits    Imaging Review No results found. I have personally reviewed and evaluated these lab results as part of my medical decision-making.   EKG Interpretation None      MDM   Final diagnoses:  None    Medications - No data to display  Will start steroids and pepcid BID; continue benadryl q6 HRS AND REWASH ALL CLOTHING.  STRICT RETURN PRECAUTIONS GIVEN.  FOLLOW UP WITH YOUR PMD FOR RECHECK I personally performed the services described in this documentation, which was scribed in my presence. The recorded information has been reviewed and is accurate.      Veatrice Kells, MD 11/30/15 [phone]

## 2015-12-01 LAB — URINE CULTURE: SPECIAL REQUESTS: NORMAL

## 2018-02-24 ENCOUNTER — Emergency Department (HOSPITAL_COMMUNITY)
Admission: EM | Admit: 2018-02-24 | Discharge: 2018-02-24 | Disposition: A | Discharge status: Home / Self Care | Payer: BLUE CROSS/BLUE SHIELD | Attending: Emergency Medicine | Admitting: Emergency Medicine

## 2018-02-24 ENCOUNTER — Other Ambulatory Visit: Payer: Self-pay

## 2018-02-24 ENCOUNTER — Encounter (HOSPITAL_COMMUNITY): Payer: Self-pay | Admitting: Emergency Medicine

## 2018-02-24 DIAGNOSIS — J45909 Unspecified asthma, uncomplicated: Secondary | ICD-10-CM | POA: Diagnosis not present

## 2018-02-24 DIAGNOSIS — I10 Essential (primary) hypertension: Secondary | ICD-10-CM

## 2018-02-24 DIAGNOSIS — F17200 Nicotine dependence, unspecified, uncomplicated: Secondary | ICD-10-CM | POA: Insufficient documentation

## 2018-02-24 DIAGNOSIS — Z79899 Other long term (current) drug therapy: Secondary | ICD-10-CM | POA: Insufficient documentation

## 2018-02-24 DIAGNOSIS — R21 Rash and other nonspecific skin eruption: Secondary | ICD-10-CM | POA: Insufficient documentation

## 2018-02-24 MED ORDER — AMLODIPINE BESYLATE 5 MG PO TABS
10.0000 mg | ORAL_TABLET | Freq: Once | ORAL | Status: AC
  Administered 2018-02-24: 10 mg via ORAL
  Filled 2018-02-24: qty 2

## 2018-02-24 MED ORDER — ACETAMINOPHEN 500 MG PO TABS
1000.0000 mg | ORAL_TABLET | Freq: Once | ORAL | Status: AC
  Administered 2018-02-24: 1000 mg via ORAL
  Filled 2018-02-24: qty 2

## 2018-02-24 MED ORDER — TRIAMCINOLONE ACETONIDE 0.1 % EX CREA: 1.0000 "application " | TOPICAL_CREAM | Freq: Two times a day (BID) | CUTANEOUS | 0 refills | Status: DC

## 2018-02-24 MED ORDER — DIPHENHYDRAMINE HCL 25 MG PO TABS: 25.0000 mg | ORAL_TABLET | Freq: Four times a day (QID) | ORAL | 0 refills | Status: DC

## 2018-02-24 MED ORDER — FAMOTIDINE 20 MG PO TABS: 20.0000 mg | ORAL_TABLET | Freq: Two times a day (BID) | ORAL | 0 refills | Status: DC

## 2018-02-24 MED ORDER — AMLODIPINE BESYLATE 10 MG PO TABS: 10.0000 mg | ORAL_TABLET | Freq: Every day | ORAL | 1 refills | Status: DC

## 2018-02-24 NOTE — ED Notes (Signed)
Pt reports not taking BP meds

## 2018-02-24 NOTE — Discharge Instructions (Signed)
Take your blood pressure medication as directed.   It is very important that you follow up with the [facility] to establish a primary care doctor to start prescribing your medication.   Also follow up with the referred [place] as needed.   Use Pepcid, Benadryl, Triamcinolone cream as directed.  Return to the Emergency Department for any worsening rash, fever, difficulty swallowing, difficulty breathing, chest pain, numbness/weakness of your extremities, vision changes, swelling of your lips or tongue or any other worsening or concerning symptoms.

## 2018-02-24 NOTE — ED Triage Notes (Signed)
Pt. Stated, Kara Ramsey had a rash and itching for 2 weeks.

## 2018-02-24 NOTE — ED Provider Notes (Signed)
[facility] Provider Note   CSN: [id number] Arrival date & time: 02/24/18  8786     History   Chief Complaint Chief Complaint  Patient presents with  . Rash  . Pruritis    HPI Kara Ramsey is a 47 y.o. female presents for evaluation of intermittent rash over the last 2 weeks.  Patient reports that she started having an erythematous diffuse rash that began 2 weeks ago.  She states that onset of symptoms, she had recently switched to a new laundry detergent.  Patient reports that her rash was red splotches on her arms and back and abdomen.  She states it is pruritic but not painful.  Patient reports that she would take Benadryl and states that the rash would go away.  Patient states that she is intermittently continued to have the rash over the last 2 weeks.  Patient states that she switched to a new laundry detergent and tried going to a baby detergent that was supposed to be good for allergies but states that she is continued to have a rash.  She denies any other new exposures.  States she has not had any recent changes in medications.  Denies any new lotions, perfumes.  Denies any history of known allergies.  On ED arrival, patient states that symptoms are resolved.  She states she is not having any rash breakout at this time does not have any itching but she states that he will intermittently appear.  Patient denies any lip or tongue swelling, throat closing sensation, difficulty breathing, difficulty tolerating her secretions or p.o., vomiting.  Of note, patient was found to be hypertensive here in the ED at 208/111.  She has a history of hypertension and is supposed to be on amlodipine 10 mg.  Patient reports that she has not taken her medication in several months as she ran out of the prescriptions.  At this time, she denies any headache, vision changes, chest pain, difficulty breathing, numbness/weakness of her extremities.  The history is provided  by the patient.    Past Medical History:  Diagnosis Date  . Asthma     There are no active problems to display for this patient.   Past Surgical History:  Procedure Laterality Date  . TUBAL LIGATION       OB History   None      Home Medications    Prior to Admission medications   Medication Sig Start Date End Date Taking? Authorizing Provider  amLODipine (NORVASC) 10 MG tablet Take 1 tablet (10 mg total) by mouth daily. 02/24/18   Volanda Napoleon, PA-C  diphenhydrAMINE (BENADRYL) 25 MG tablet Take 1 tablet (25 mg total) by mouth every 6 (six) hours. 02/24/18   Volanda Napoleon, PA-C  famotidine (PEPCID) 20 MG tablet Take 1 tablet (20 mg total) by mouth 2 (two) times daily. 02/24/18   Volanda Napoleon, PA-C  metroNIDAZOLE (FLAGYL) 500 MG tablet Take 1 tablet (500 mg total) by mouth 2 (two) times daily. One po bid x 7 days Patient not taking: Reported on 11/30/2015 03/01/13   Jamse Mead, PA-C  predniSONE (DELTASONE) 20 MG tablet 3 tabs po day one, then 2 po daily x 4 days 11/30/15   Palumbo, April, MD  triamcinolone cream (KENALOG) 0.1 % Apply 1 application topically 2 (two) times daily. 02/24/18   Volanda Napoleon, PA-C    Family History No family history on file.  Social History Social History   Tobacco Use  .  Smoking status: Current Every Day Smoker  . Smokeless tobacco: Current User  Substance Use Topics  . Alcohol use: Yes  . Drug use: No     Allergies   Patient has no known allergies.   Review of Systems Review of Systems  HENT: Negative for trouble swallowing.   Eyes: Negative for visual disturbance.  Respiratory: Negative for shortness of breath.   Cardiovascular: Negative for chest pain.  Gastrointestinal: Negative for nausea and vomiting.  Skin: Positive for rash.  Neurological: Negative for weakness, numbness and headaches.     Physical Exam Updated Vital Signs BP (!) 168/103   Pulse 72   Temp 98.6 F (37 C) (Oral)   Resp 16   Ht  5\' 4"  (1.626 m)   Wt 86.2 kg (190 lb)   SpO2 100%   BMI 32.61 kg/m   Physical Exam  Constitutional: She is oriented to person, place, and time. She appears well-developed and well-nourished.  HENT:  Head: Normocephalic and atraumatic.  Mouth/Throat: Oropharynx is clear and moist and mucous membranes are normal.  Airways patent, phonation is intact.  No evidence of oral angioedema.  No oral lesions noted.  Eyes: Pupils are equal, round, and reactive to light. Conjunctivae, EOM and lids are normal.  Neck: Full passive range of motion without pain.  Cardiovascular: Normal rate, regular rhythm, normal heart sounds and normal pulses. Exam reveals no gallop and no friction rub.  No murmur heard. Pulmonary/Chest: Effort normal and breath sounds normal.  Lungs clear to auscultation bilaterally.  Symmetric chest rise.  No wheezing, rales, rhonchi. The exam was performed with a chaperone present. No abnormalities of the right breast. Left breast has a small thickened, scaly area noted to the lateral aspect. No palpable mass. No abnormalities of the nipple or areola.   Abdominal: Soft. Normal appearance. There is no tenderness. There is no rigidity and no guarding.  Musculoskeletal: Normal range of motion.  Neurological: She is alert and oriented to person, place, and time.  Cranial nerves III-XII intact Follows commands, Moves all extremities  5/5 strength to BUE and BLE  Sensation intact throughout all major nerve distributions Normal finger to nose. No dysdiadochokinesia. No pronator drift. No gait abnormalities  No slurred speech. No facial droop.   Skin: Skin is warm and dry. Capillary refill takes less than 2 seconds. No rash noted.  No evidence of rash at this time.  No rash noted on palms or soles of feet.  Psychiatric: She has a normal mood and affect. Her speech is normal.  Nursing note and vitals reviewed.    ED Treatments / Results  Labs (all labs ordered are listed, but only  abnormal results are displayed) Labs Reviewed - No data to display  EKG None  Radiology No results found.  Procedures Procedures (including critical care time)  Medications Ordered in ED Medications  amLODipine (NORVASC) tablet 10 mg (10 mg Oral Given 02/24/18 1303)  acetaminophen (TYLENOL) tablet 1,000 mg (1,000 mg Oral Given 02/24/18 1313)     Initial Impression / Assessment and Plan / ED Course  I have reviewed the triage vital signs and the nursing notes.  Pertinent labs & imaging results that were available during my care of the patient were reviewed by me and considered in my medical decision making (see chart for details).     47 year old female who presents for evaluation of intermittent rash that is been ongoing for last 2 weeks.  States that it is pruritic but not painful.  States that initially when it started, she was using a new detergent.  Reports switching back to it but still having intermittent symptoms.  No other new exposures.  No difficulty tolerating secretions, p.o., difficulty breathing.  Today states that the rash has improved but she wanted to get it checked out.  On initial ED arrival, patient was afebrile.  She was noted to be hypertensive.  Patient states that she has previously been on amlodipine but states that she has not taken the medication for approximately a month or so because she could not get her prescription refilled.  At this time, she denies any headache, vision changes, numbness/weakness of her extremities, chest pain, difficulty breathing.  Normal neuro exam.  History/physical exam is not concerning for hypertensive emergency.  Suspect that it is more likely reflective of patient's noncompliance status.  Patient has her prescription here with her and it shows she is on 10 mg of amlodipine daily.  We will plan to give her daily dose of blood pressure medication here in the ED and reassess.  Patient has no rash on my exam.  She does report symptoms are  improved.  Consider contact dermatitis.  History/physical exam is not concerning for SJS, TENS.  Review of records show that she does have a history of eczema.  This could be an eczema flare.  We will plan to send home with supportive therapies.  Repeat blood pressure after amlodipine shows improvement.  Systolic is 606-3 70.  Diastolic is still slightly elevated.  We will send her home with a prescription for amlodipine.  Patient does not have a primary care doctor at this time.  We will plan to give her outpatient: Referral for primary care evaluation.  Additionally, patient asked me to evaluate a area on the lateral breast.  She states that it is dry and thickened.  No mass, nipple discharge, warmth, erythema.  On my examination, she has a small scaly, thickened area to the lateral aspect of the left breast.  No evidence of abscess or mass.  We will plan to give outpatient referral to [facility] breast center for further evaluation.  Patient stable for discharge at this time. Patient had ample opportunity for questions and discussion. All patient's questions were answered with full understanding. Strict return precautions discussed. Patient expresses understanding and agreement to plan.   Final Clinical Impressions(s) / ED Diagnoses   Final diagnoses:  Rash  Hypertension, unspecified type    ED Discharge Orders        Ordered    diphenhydrAMINE (BENADRYL) 25 MG tablet  Every 6 hours     02/24/18 1336    famotidine (PEPCID) 20 MG tablet  2 times daily     02/24/18 1336    triamcinolone cream (KENALOG) 0.1 %  2 times daily     02/24/18 1336    amLODipine (NORVASC) 10 MG tablet  Daily     02/24/18 1336       Desma Mcgregor 02/24/18 1433    Hayden Rasmussen, MD 02/25/18 1141

## 2018-02-24 NOTE — ED Notes (Signed)
Pt verbalized understanding of discharge instructions.

## 2019-06-20 ENCOUNTER — Other Ambulatory Visit: Payer: Self-pay | Admitting: *Deleted

## 2019-06-20 DIAGNOSIS — Z1231 Encounter for screening mammogram for malignant neoplasm of breast: Secondary | ICD-10-CM

## 2019-07-03 ENCOUNTER — Ambulatory Visit: Payer: BLUE CROSS/BLUE SHIELD

## 2019-08-18 ENCOUNTER — Ambulatory Visit: Payer: BLUE CROSS/BLUE SHIELD

## 2020-01-22 ENCOUNTER — Ambulatory Visit (HOSPITAL_COMMUNITY)
Admission: EM | Admit: 2020-01-22 | Discharge: 2020-01-22 | Disposition: A | Discharge status: Home / Self Care | Payer: 59 | Attending: Physician Assistant | Admitting: Physician Assistant

## 2020-01-22 ENCOUNTER — Other Ambulatory Visit: Payer: Self-pay

## 2020-01-22 ENCOUNTER — Encounter (HOSPITAL_COMMUNITY): Payer: Self-pay

## 2020-01-22 DIAGNOSIS — L853 Xerosis cutis: Secondary | ICD-10-CM

## 2020-01-22 DIAGNOSIS — Z760 Encounter for issue of repeat prescription: Secondary | ICD-10-CM

## 2020-01-22 DIAGNOSIS — I160 Hypertensive urgency: Secondary | ICD-10-CM | POA: Diagnosis present

## 2020-01-22 DIAGNOSIS — I10 Essential (primary) hypertension: Secondary | ICD-10-CM

## 2020-01-22 LAB — BASIC METABOLIC PANEL
Anion gap: 11 (ref 5–15)
BUN: 10 mg/dL (ref 6–20)
CO2: 26 mmol/L (ref 22–32)
Calcium: 9.1 mg/dL (ref 8.9–10.3)
Chloride: 99 mmol/L (ref 98–111)
Creatinine, Ser: 0.62 mg/dL (ref 0.44–1.00)
GFR calc Af Amer: >60 mL/min (ref >60)
GFR calc non Af Amer: >60 mL/min (ref >60)
Glucose, Bld: 94 mg/dL (ref 70–99)
Potassium: 3.8 mmol/L (ref 3.5–5.1)
Sodium: 136 mmol/L (ref 135–145)

## 2020-01-22 MED ORDER — TRIAMCINOLONE ACETONIDE 0.1 % EX CREA: 1.0000 "application " | TOPICAL_CREAM | Freq: Two times a day (BID) | CUTANEOUS | 0 refills | Status: DC

## 2020-01-22 MED ORDER — LISINOPRIL 10 MG PO TABS: 10.0000 mg | ORAL_TABLET | Freq: Every day | ORAL | 0 refills | Status: DC

## 2020-01-22 MED ORDER — AMLODIPINE BESYLATE 10 MG PO TABS: 10.0000 mg | ORAL_TABLET | Freq: Every day | ORAL | 0 refills | Status: DC

## 2020-01-22 NOTE — Discharge Instructions (Addendum)
Restart your blood pressure medicines. If you feel lightheaded I want you to take half of the tablet of amlodipine and increase to 1 full tablet after a few days.  Please call the internal medicine center to establish care for primary care.  I have refilled the triamcinolone, use this cream only when severe dry itchy skin.  I also want you to use O'Keefe's hand lotion.  If you have chest pain, shortness of breath severe headache, feel weak, dizzy or have numbness or tingling or change in speech please report to the emergency department.

## 2020-01-22 NOTE — ED Triage Notes (Signed)
Pt presents for medication refill on blood pressure medication and eczema cream.

## 2020-01-22 NOTE — ED Provider Notes (Signed)
[facility]    CSN: [id number] Arrival date & time: 01/22/20  0801      History   Chief Complaint Chief Complaint  Patient presents with  . Medication Refill    HPI Kara Ramsey is a 49 y.o. female.   Patient with past medical history of hypertension presents urgent care for blood pressure and medication refill.  She reports she is doing well with her medications been taking her lisinopril as needed when feeling that her blood pressure is elevated.  She reports she was previously controlled on 10 mg lisinopril and 10 mg amlodipine.  She reports blood pressures in the [id number] and [id number] systolics with a regimen.  She reports she has not taken both of these together for at least 2 months.  She was previously managed the urgent cares as she did not have primary care or insurance.  She reports intermittent level headache is a 3/10 intensity when it occurs and believes that this is related to blood pressure.  She denies headache in clinic today.  Denies chest pain, shortness of breath, blurry vision, slurred speech, numbness and weakness and tingling.  She is also concerned about dry skin patches on her hands that comes and goes.  She reports she was previously given steroid creams with good effect.  She reports she is wearing gloves on and off frequently washes her hands.  She believes that this is related to eczema.     Past Medical History:  Diagnosis Date  . Asthma     There are no problems to display for this patient.   Past Surgical History:  Procedure Laterality Date  . TUBAL LIGATION      OB History   No obstetric history on file.      Home Medications    Prior to Admission medications   Medication Sig Start Date End Date Taking? Authorizing Provider  amLODipine (NORVASC) 10 MG tablet Take 1 tablet (10 mg total) by mouth daily. 01/22/20 02/21/20  Kaidon Kinker, Marguerita Beards, PA-C  diphenhydrAMINE (BENADRYL) 25 MG tablet Take 1 tablet (25 mg total) by mouth every 6 (six)  hours. 02/24/18   Volanda Napoleon, PA-C  famotidine (PEPCID) 20 MG tablet Take 1 tablet (20 mg total) by mouth 2 (two) times daily. 02/24/18   Volanda Napoleon, PA-C  lisinopril (ZESTRIL) 10 MG tablet Take 1 tablet (10 mg total) by mouth daily. 01/22/20 02/21/20  Wasif Simonich, Marguerita Beards, PA-C  metroNIDAZOLE (FLAGYL) 500 MG tablet Take 1 tablet (500 mg total) by mouth 2 (two) times daily. One po bid x 7 days Patient not taking: Reported on 11/30/2015 03/01/13   Jamse Mead, PA-C  predniSONE (DELTASONE) 20 MG tablet 3 tabs po day one, then 2 po daily x 4 days 11/30/15   Palumbo, April, MD  triamcinolone cream (KENALOG) 0.1 % Apply 1 application topically 2 (two) times daily. 01/22/20   Faylinn Schwenn, Marguerita Beards, PA-C    Family History Family History  Family history unknown: Yes    Social History Social History   Tobacco Use  . Smoking status: Current Every Day Smoker  . Smokeless tobacco: Current User  Substance Use Topics  . Alcohol use: Yes  . Drug use: No     Allergies   Patient has no known allergies.   Review of Systems Review of Systems Per HPI  Physical Exam Triage Vital Signs ED Triage Vitals  Enc Vitals Group     BP      Pulse  Resp      Temp      Temp src      SpO2      Weight      Height      Head Circumference      Peak Flow      Pain Score      Pain Loc      Pain Edu?      Excl. in [place]?    No data found.  Updated Vital Signs BP (!) 195/106 (BP Location: Left Arm) Comment: no medication  Pulse 89   Temp 98.1 F (36.7 C) (Oral)   Resp 18   SpO2 95%   Visual Acuity Right Eye Distance:   Left Eye Distance:   Bilateral Distance:    Right Eye Near:   Left Eye Near:    Bilateral Near:     Physical Exam Vitals and nursing note reviewed.  Constitutional:      General: She is not in acute distress.    Appearance: Normal appearance. She is well-developed. She is not ill-appearing.  HENT:     Head: Normocephalic and atraumatic.  Eyes:      Conjunctiva/sclera: Conjunctivae normal.  Cardiovascular:     Rate and Rhythm: Normal rate and regular rhythm.     Heart sounds: No murmur.  Pulmonary:     Effort: Pulmonary effort is normal. No respiratory distress.     Breath sounds: Normal breath sounds.  Abdominal:     Palpations: Abdomen is soft.     Tenderness: There is no abdominal tenderness.  Musculoskeletal:     Cervical back: Neck supple.  Skin:    General: Skin is warm and dry.     Comments: There is some dry skin around the hands.  Patient points to patches on the palms of the hand.  No obvious eczematous lesions  Neurological:     Mental Status: She is alert.      UC Treatments / Results  Labs (all labs ordered are listed, but only abnormal results are displayed) Labs Reviewed  BASIC METABOLIC PANEL    EKG   Radiology No results found.  Procedures Procedures (including critical care time)  Medications Ordered in UC Medications - No data to display  Initial Impression / Assessment and Plan / UC Course  I have reviewed the triage vital signs and the nursing notes.  Pertinent labs & imaging results that were available during my care of the patient were reviewed by me and considered in my medical decision making (see chart for details).  Chart review of previous encounters and lab work was conducted.    #Hypertensive urgency #Essential hypertension #Dry skin #Medication refill Patient is a 49 year old has medical history of hypertension presenting with hypertensive urgency 195/106.  She has had poor follow-up and has been noncompliant in the past.  Patient verbalizing establishing primary care.  BMP within normal limits.  She is asymptomatic from blood pressure standpoint.  Will start lisinopril 10 and amlodipine 10.  Primary care resources given.  Patient instructed to follow-up for further management.  Triamcinolone for skin patches and recommended over-the-counter okeefes.  - ED precautions discussed  - Patient verbalizes and agrees with plan Final Clinical Impressions(s) / UC Diagnoses   Final diagnoses:  Medication refill  Essential hypertension  Dry skin  Hypertensive urgency     Discharge Instructions     Restart your blood pressure medicines. If you feel lightheaded I want you to take half of the tablet of  amlodipine and increase to 1 full tablet after a few days.  Please call the internal medicine center to establish care for primary care.  I have refilled the triamcinolone, use this cream only when severe dry itchy skin.  I also want you to use O'Keefe's hand lotion.  If you have chest pain, shortness of breath severe headache, feel weak, dizzy or have numbness or tingling or change in speech please report to the emergency department.      ED Prescriptions    Medication Sig Dispense Auth. Provider   amLODipine (NORVASC) 10 MG tablet Take 1 tablet (10 mg total) by mouth daily. 30 tablet Tkai Serfass, Marguerita Beards, PA-C   lisinopril (ZESTRIL) 10 MG tablet Take 1 tablet (10 mg total) by mouth daily. 30 tablet Othmar Ringer, Marguerita Beards, PA-C   triamcinolone cream (KENALOG) 0.1 %  (Status: Discontinued) Apply 1 application topically 2 (two) times daily. 30 g Haely Leyland, Marguerita Beards, PA-C   triamcinolone cream (KENALOG) 0.1 % Apply 1 application topically 2 (two) times daily. 30 g Samari Bittinger, Marguerita Beards, PA-C     PDMP not reviewed this encounter.   Purnell Shoemaker, PA-C 01/22/20 2359

## 2020-02-22 ENCOUNTER — Encounter (HOSPITAL_COMMUNITY): Payer: Self-pay | Admitting: Emergency Medicine

## 2020-02-22 ENCOUNTER — Emergency Department (HOSPITAL_COMMUNITY)
Admission: EM | Admit: 2020-02-22 | Discharge: 2020-02-22 | Disposition: A | Discharge status: Home / Self Care | Payer: 59 | Attending: Emergency Medicine | Admitting: Emergency Medicine

## 2020-02-22 ENCOUNTER — Emergency Department (HOSPITAL_COMMUNITY): Payer: 59

## 2020-02-22 DIAGNOSIS — Z79899 Other long term (current) drug therapy: Secondary | ICD-10-CM | POA: Insufficient documentation

## 2020-02-22 DIAGNOSIS — S161XXA Strain of muscle, fascia and tendon at neck level, initial encounter: Secondary | ICD-10-CM | POA: Insufficient documentation

## 2020-02-22 DIAGNOSIS — Y92410 Unspecified street and highway as the place of occurrence of the external cause: Secondary | ICD-10-CM | POA: Insufficient documentation

## 2020-02-22 DIAGNOSIS — Y93I9 Activity, other involving external motion: Secondary | ICD-10-CM | POA: Diagnosis not present

## 2020-02-22 DIAGNOSIS — G44319 Acute post-traumatic headache, not intractable: Secondary | ICD-10-CM | POA: Diagnosis not present

## 2020-02-22 DIAGNOSIS — Y999 Unspecified external cause status: Secondary | ICD-10-CM | POA: Insufficient documentation

## 2020-02-22 DIAGNOSIS — F17210 Nicotine dependence, cigarettes, uncomplicated: Secondary | ICD-10-CM | POA: Diagnosis not present

## 2020-02-22 DIAGNOSIS — J45909 Unspecified asthma, uncomplicated: Secondary | ICD-10-CM | POA: Insufficient documentation

## 2020-02-22 DIAGNOSIS — S0990XA Unspecified injury of head, initial encounter: Secondary | ICD-10-CM | POA: Diagnosis present

## 2020-02-22 MED ORDER — NAPROXEN 500 MG PO TABS: 500.0000 mg | ORAL_TABLET | Freq: Two times a day (BID) | ORAL | 0 refills | Status: DC

## 2020-02-22 MED ORDER — METHOCARBAMOL 500 MG PO TABS: 500.0000 mg | ORAL_TABLET | Freq: Two times a day (BID) | ORAL | 0 refills | Status: DC

## 2020-02-22 MED ORDER — ACETAMINOPHEN 500 MG PO TABS
1000.0000 mg | ORAL_TABLET | Freq: Once | ORAL | Status: AC
  Administered 2020-02-22: 1000 mg via ORAL
  Filled 2020-02-22: qty 2

## 2020-02-22 NOTE — ED Notes (Signed)
Patient transported to CT 
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
## 2020-02-22 NOTE — ED Notes (Signed)
Pt d/c home per MD order. Discharge summary reviewed with pt, pt verbalizes understanding. Ambulatory off unit. No s/s of acute distress noted.
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
## 2020-02-22 NOTE — ED Notes (Signed)
Pt back from CT

## 2020-03-31 ENCOUNTER — Ambulatory Visit: Payer: 59 | Admitting: Internal Medicine

## 2020-03-31 ENCOUNTER — Encounter (INDEPENDENT_AMBULATORY_CARE_PROVIDER_SITE_OTHER): Payer: Self-pay

## 2020-03-31 VITALS — BP 168/92 | HR 77 | Temp 98.2°F | Ht 64.0 in | Wt 198.6 lb

## 2020-03-31 DIAGNOSIS — I10 Essential (primary) hypertension: Secondary | ICD-10-CM | POA: Diagnosis not present

## 2020-03-31 DIAGNOSIS — Z131 Encounter for screening for diabetes mellitus: Secondary | ICD-10-CM

## 2020-03-31 DIAGNOSIS — Z1159 Encounter for screening for other viral diseases: Secondary | ICD-10-CM | POA: Diagnosis not present

## 2020-03-31 DIAGNOSIS — Z114 Encounter for screening for human immunodeficiency virus [HIV]: Secondary | ICD-10-CM

## 2020-03-31 DIAGNOSIS — Z0000 Encounter for general adult medical examination without abnormal findings: Secondary | ICD-10-CM | POA: Insufficient documentation

## 2020-03-31 LAB — POCT GLYCOSYLATED HEMOGLOBIN (HGB A1C): Hemoglobin A1C: 5.7 % — AB (ref 4.0–5.6)

## 2020-03-31 LAB — GLUCOSE, CAPILLARY: Glucose-Capillary: 105 mg/dL — ABNORMAL HIGH (ref 70–99)

## 2020-03-31 MED ORDER — LISINOPRIL 10 MG PO TABS: 10.0000 mg | ORAL_TABLET | Freq: Every day | ORAL | 3 refills | Status: DC

## 2020-03-31 MED ORDER — AMLODIPINE BESYLATE 10 MG PO TABS: 10.0000 mg | ORAL_TABLET | Freq: Every day | ORAL | 11 refills | Status: DC

## 2020-03-31 NOTE — Assessment & Plan Note (Signed)
Kara Ramsey reports several year history of HTN that has been intermittently treated. She endorses acute headaches that are bilateral when her pressures are up but not vision changes, dizziness, chest pain, SOB, leg swelling. She is currently having a headache now as she has been out of medication for 1 month. She usually takes Lisinopril 10 mg and Amlodipine 10 mg that has been previously prescribed by urgent cares.   Assessment/Plan:  Uncontrolled at this time. Will restart medication and follow up in 1 month.   - Lisinopril 10 mg  - Amlodipine 10 mg  - BMP - Lipid Panel - 1 month follow up

## 2020-03-31 NOTE — Progress Notes (Addendum)
   CC: HTN; establish care  HPI:  [name] is a 49 y.o. with a PMHx of HTN who presents to the clinic for HTN; establish care.   Please see the Encounters tab for problem-based Assessment & Plan regarding status of patient's chronic conditions.  Past Medical History:  Diagnosis Date  . Asthma    Past Surgical History:  None  Past Family History: Sister: Breast cancer, brain aneurysm (passed away at 47 y/o) Positive family history for pancreatic cancer, throat cancer  Social History:  Current daily tobacco use  Drinks alcohol only on weekends Uses marijuana 3-4 times per week; no other drug use Lives at home with husband and three sons  Review of Systems: Review of Systems  Constitutional: Negative for chills, fever and weight loss.  Eyes: Negative for blurred vision and double vision.  Respiratory: Negative for cough, shortness of breath and wheezing.   Cardiovascular: Negative for chest pain, palpitations and leg swelling.  Gastrointestinal: Negative for abdominal pain, diarrhea, nausea and vomiting.  Neurological: Positive for headaches. Negative for dizziness.   Physical Exam:  Vitals:   03/31/20 1035 03/31/20 1052  BP: (!) 175/107 (!) 168/92  Pulse: 87 77  Temp: 98.2 F (36.8 C)   TempSrc: Oral   SpO2: 98%   Weight: 198 lb 9.6 oz (90.1 kg)   Height: 5\' 4"  (1.626 m)    Physical Exam Vitals and nursing note reviewed.  Constitutional:      General: She is not in acute distress.    Appearance: She is normal weight.  HENT:     Head: Normocephalic and atraumatic.  Cardiovascular:     Rate and Rhythm: Normal rate and regular rhythm.     Heart sounds: No murmur heard.  No gallop.   Pulmonary:     Effort: Pulmonary effort is normal. No respiratory distress.     Breath sounds: No wheezing, rhonchi or rales.  Abdominal:     General: Bowel sounds are normal.     Palpations: Abdomen is soft.  Musculoskeletal:     Right lower leg: No edema.     Left  lower leg: No edema.  Skin:    General: Skin is warm and dry.  Neurological:     General: No focal deficit present.     Mental Status: She is alert and oriented to person, place, and time. Mental status is at baseline.  Psychiatric:        Mood and Affect: Mood normal.        Behavior: Behavior normal.    Assessment & Plan:   See Encounters Tab for problem based charting.  Patient discussed with Dr. Rebeca Alert

## 2020-03-31 NOTE — Patient Instructions (Addendum)
It was nice seeing you today! Thank you for choosing [facility] for your Primary Care.    Today we talked about:   1. Blood Pressure: I will restart your medications today with refills as well. 2. Blood Work: We will check some screening blood work today.  3. Pap smear at next visit   Please call the clinic with questions or concerns: [phone]

## 2020-04-01 LAB — BMP8+ANION GAP
Anion Gap: 15 mmol/L (ref 10.0–18.0)
BUN/Creatinine Ratio: 20 (ref 9–23)
BUN: 12 mg/dL (ref 6–24)
CO2: 23 mmol/L (ref 20–29)
Calcium: 10.8 mg/dL — ABNORMAL HIGH (ref 8.7–10.2)
Chloride: 97 mmol/L (ref 96–106)
Creatinine, Ser: 0.61 mg/dL (ref 0.57–1.00)
GFR calc Af Amer: 125 mL/min/{1.73_m2} (ref >59)
GFR calc non Af Amer: 108 mL/min/{1.73_m2} (ref >59)
Glucose: 93 mg/dL (ref 65–99)
Potassium: 4.5 mmol/L (ref 3.5–5.2)
Sodium: 135 mmol/L (ref 134–144)

## 2020-04-01 LAB — HIV ANTIBODY (ROUTINE TESTING W REFLEX): HIV Screen 4th Generation wRfx: NONREACTIVE

## 2020-04-01 LAB — LIPID PANEL
Chol/HDL Ratio: 4.4 ratio (ref 0.0–4.4)
Cholesterol, Total: 197 mg/dL (ref 100–199)
HDL: 45 mg/dL (ref >39)
LDL Chol Calc (NIH): 101 mg/dL — ABNORMAL HIGH (ref 0–99)
Triglycerides: 303 mg/dL — ABNORMAL HIGH (ref 0–149)
VLDL Cholesterol Cal: 51 mg/dL — ABNORMAL HIGH (ref 5–40)

## 2020-04-01 LAB — HEPATITIS C ANTIBODY: Hep C Virus Ab: <0.1 s/co ratio (ref 0.0–0.9)

## 2020-04-01 NOTE — Progress Notes (Signed)
Internal Medicine Clinic Attending  Case discussed with Dr. Charleen Kirks at the time of the visit.  We reviewed the resident's history and exam and pertinent patient test results.  I agree with the assessment, diagnosis, and plan of care documented in the resident's note.  Will continue to work on addressing preventive care at next visit. Hypercalcemia noted on BMP, will recheck at follow up and investigate further if still elevated.   Lenice Pressman, M.D., Ph.D.

## 2020-04-29 ENCOUNTER — Encounter: Payer: Self-pay | Admitting: Internal Medicine

## 2020-04-29 ENCOUNTER — Ambulatory Visit (INDEPENDENT_AMBULATORY_CARE_PROVIDER_SITE_OTHER): Payer: 59 | Admitting: Internal Medicine

## 2020-04-29 DIAGNOSIS — L309 Dermatitis, unspecified: Secondary | ICD-10-CM

## 2020-04-29 DIAGNOSIS — I10 Essential (primary) hypertension: Secondary | ICD-10-CM

## 2020-04-29 MED ORDER — TRIAMCINOLONE ACETONIDE 0.1 % EX CREA
1.0000 | TOPICAL_CREAM | Freq: Two times a day (BID) | CUTANEOUS | 0 refills | Status: DC
Start: 2020-04-29 — End: 2020-05-03

## 2020-04-29 MED ORDER — LISINOPRIL 20 MG PO TABS: 20.0000 mg | ORAL_TABLET | Freq: Every day | ORAL | 2 refills | Status: DC

## 2020-04-29 NOTE — Patient Instructions (Addendum)
Kara Ramsey,  It was a pleasure seeing you in clinic. Today we discussed:   Eczema: I have refilled your triamcinoclone cream. Please contact us if this does not resolve over the next few days.    Hypertension: At this time, please increase your lisinopril to 20mg  (2 tablets of 10mg  each) daily. Please continue to take your amlodipine 10mg  daily. Please follow up in 4 weeks for BP check and repeat blood work.   If you have any questions or concerns, please call our clinic at [phone] between 9am-5pm and after hours call [phone] and ask for the internal medicine resident on call. If you feel you are having a medical emergency please call 911.   Thank you, we look forward to helping you remain healthy!  If you have not already done so, I recommend getting your COVID 19 vaccine. To schedule an appointment for a COVID vaccine or be added to the vaccine wait list: Go to [email or web address]   OR Go to [email or web address]                  OR Call [phone]                                     OR Call [phone] and select Option 2

## 2020-04-29 NOTE — Progress Notes (Signed)
   CC: eczema f/u  HPI:  [name] is a 49 y.o. female with PMHx of hypertension, asthma and eczema presenting for f/u of her eczema. She notes recent flare on palm of her hands in setting of running out of her triamcinoclone cream. Denies any fevers/chills. Please see problem based charting for complete assessment and plan.  Past Medical History:  Diagnosis Date  . Asthma    Review of Systems:  Negative except as stated in HPI.   Physical Exam:  Vitals:   04/29/20 1353 04/29/20 1412  BP: (!) 166/98 (!) 157/97  Pulse: 95   Temp: 98.2 F (36.8 C)   TempSrc: Oral   SpO2: 99%   Weight: 194 lb 11.2 oz (88.3 kg)   Height: 5' 4.5" (1.638 m)    Physical Exam  Constitutional: Appears well-developed and well-nourished. No distress.  HENT: Normocephalic and atraumatic, EOMI, conjunctiva normal, moist mucous membranes Cardiovascular: Normal rate, regular rhythm, S1 and S2 present, no murmurs, rubs, gallops.  Distal pulses intact Respiratory: No respiratory distress, no accessory muscle use.  Effort is normal.  Lungs are clear to auscultation bilaterally. Musculoskeletal: Normal bulk and tone.  No peripheral edema noted. Neurological: Is alert and oriented x4, no apparent focal deficits noted. Skin: Warm and dry.   Left palm: 1-2cm area of hyperpigmented, keratinized lesion at base of thumb without surrounding erythema or warmth- itchy Similar 1cm area of hyperpigmented, keratinized lesion at the hypothenar region without erythema or warmth     Assessment & Plan:   See Encounters Tab for problem based charting.  Patient discussed with Dr. Jimmye Norman

## 2020-04-30 DIAGNOSIS — L309 Dermatitis, unspecified: Secondary | ICD-10-CM | POA: Insufficient documentation

## 2020-04-30 NOTE — Assessment & Plan Note (Signed)
Ms. Boissonneault has a history of poorly controlled hypertension with intermittent acute headaches. She is on amlodipine 10mg  daily and lisinopril 10mg  daily. She does not check her BP at home but does endorse medication compliance. She denies any vision changes, chest pain, dizziness/lightheadedness, shortness of breath or lower extremity swelling.  BP Readings from Last 3 Encounters:  04/29/20 (!) 157/97  03/31/20 (!) 168/92  02/22/20 124/74   BP remains elevated at today's visit.   Plan: Continue amlodipine 10mg  daily Increase lisinopril to 20mg  daily BP check and BMP in 4 weeks

## 2020-04-30 NOTE — Assessment & Plan Note (Signed)
Kara Ramsey notes long standing history of eczema for which she has been using triamcinoclone cream but has recently ran out. She endorses eczema flare, particularly on her hands over the past several days. On examination, she has areas of hyperpigmentation with keratinization that are pruritic located at the thenar and hypothenar regions of her left palm. No similar lesions noted on the right palm at this time.   Plan:  Triamcinoclone cream 0.1% refilled Continue to monitor

## 2020-05-03 ENCOUNTER — Telehealth: Payer: Self-pay | Admitting: *Deleted

## 2020-05-03 DIAGNOSIS — L309 Dermatitis, unspecified: Secondary | ICD-10-CM

## 2020-05-03 MED ORDER — BETAMETHASONE DIPROPIONATE 0.05 % EX OINT: TOPICAL_OINTMENT | Freq: Two times a day (BID) | CUTANEOUS | 0 refills | Status: DC

## 2020-05-03 NOTE — Progress Notes (Signed)
Internal Medicine Clinic Attending ° °Case discussed with Dr. Aslam  At the time of the visit.  We reviewed the resident’s history and exam and pertinent patient test results.  I agree with the assessment, diagnosis, and plan of care documented in the resident’s note.  °

## 2020-05-03 NOTE — Addendum Note (Signed)
Addended by: Mosetta Anis on: 05/03/2020 11:09 AM   Modules accepted: Orders

## 2020-05-03 NOTE — Telephone Encounter (Signed)
She is only on medium potency topical steroid. I will send higher potency meds

## 2020-05-03 NOTE — Telephone Encounter (Signed)
Pt states she needs prednisone called in, states the eczema is not better, please advise

## 2020-05-11 ENCOUNTER — Encounter: Payer: 59 | Admitting: Internal Medicine

## 2020-05-21 ENCOUNTER — Ambulatory Visit (INDEPENDENT_AMBULATORY_CARE_PROVIDER_SITE_OTHER): Payer: 59 | Admitting: Internal Medicine

## 2020-05-21 ENCOUNTER — Other Ambulatory Visit: Payer: Self-pay

## 2020-05-21 DIAGNOSIS — L308 Other specified dermatitis: Secondary | ICD-10-CM | POA: Diagnosis not present

## 2020-05-21 MED ORDER — PREDNISONE 10 MG (21) PO TBPK: ORAL_TABLET | ORAL | 0 refills | Status: DC

## 2020-05-21 NOTE — Progress Notes (Signed)
  [facility] Residency Telephone Encounter Continuity Care Appointment  HPI:   This telephone encounter was created for [name] on 05/21/2020 for the following purpose/cc eczema.   Past Medical History:  Past Medical History:  Diagnosis Date  . Asthma       ROS:      Assessment / Plan / Recommendations:   Please see A&P under problem oriented charting for assessment of the patient's acute and chronic medical conditions.   As always, pt is advised that if symptoms worsen or new symptoms arise, they should go to an urgent care facility or to to ER for further evaluation.   Consent and Medical Decision Making:   Patient discussed with Dr. Philipp Ovens  This is a telephone encounter between Kara Ramsey and Jose Persia on 05/21/2020 for eczema. The visit was conducted with the patient located at home and Jose Persia at [facility]. The patient's identity was confirmed using their DOB and current address. The patient has consented to being evaluated through a telephone encounter and understands the associated risks (an examination cannot be done and the patient may need to come in for an appointment) / benefits (allows the patient to remain at home, decreasing exposure to coronavirus). I personally spent 10 minutes on medical discussion.

## 2020-05-24 ENCOUNTER — Telehealth: Payer: Self-pay

## 2020-05-24 NOTE — Telephone Encounter (Signed)
ERROR

## 2020-05-26 NOTE — Assessment & Plan Note (Signed)
Kara Ramsey states that in the past couple days, her bilateral hand eczema has significantly worsened and she is in a lot of pain, uncomfortable, itchy and is having peeling of her skin.  She tried the cream prescribed at last appointment, betamethasone, and did not find that it helped at all.  She is using daily emollients and using gloves when washing dishes.  She is frustrated with the lack of improvement with topical treatment and is hoping for oral medication recommendations.  She notes she has followed up with dermatology in the past but is been several years since she seen them again.  Assessment/plan: Difficult to discern the severity of patient's eczema since this is a telehealth appointment.  Oral prednisone can be used for severe acute flares, but is most commonly treated with only topical agents.  Given severity described by the patient and her limited access to in person evaluation at this time, will go ahead with a prednisone taper and referral to dermatology.  Strongly encouraged patient to make an in-person evaluation within the next week.   -Sterapred taper -Referral to dermatology -1 week follow-up in person

## 2020-05-28 NOTE — Progress Notes (Signed)
Internal Medicine Clinic Attending  Case discussed with Dr. Basaraba  At the time of the visit.  We reviewed the resident's history and exam and pertinent patient test results.  I agree with the assessment, diagnosis, and plan of care documented in the resident's note.  

## 2020-06-08 ENCOUNTER — Other Ambulatory Visit (HOSPITAL_COMMUNITY)
Admission: RE | Admit: 2020-06-08 | Discharge: 2020-06-08 | Disposition: A | Discharge status: Home / Self Care | Payer: 59 | Source: Ambulatory Visit | Attending: Student in an Organized Health Care Education/Training Program | Admitting: Student in an Organized Health Care Education/Training Program

## 2020-06-08 ENCOUNTER — Encounter: Payer: Self-pay | Admitting: Internal Medicine

## 2020-06-08 ENCOUNTER — Other Ambulatory Visit: Payer: Self-pay

## 2020-06-08 ENCOUNTER — Ambulatory Visit: Payer: 59 | Admitting: Internal Medicine

## 2020-06-08 ENCOUNTER — Telehealth: Payer: Self-pay

## 2020-06-08 VITALS — BP 135/73 | HR 86 | Temp 99.1°F | Ht 64.0 in | Wt 197.9 lb

## 2020-06-08 DIAGNOSIS — L309 Dermatitis, unspecified: Secondary | ICD-10-CM

## 2020-06-08 DIAGNOSIS — I10 Essential (primary) hypertension: Secondary | ICD-10-CM | POA: Diagnosis not present

## 2020-06-08 DIAGNOSIS — Z124 Encounter for screening for malignant neoplasm of cervix: Secondary | ICD-10-CM

## 2020-06-08 DIAGNOSIS — N393 Stress incontinence (female) (male): Secondary | ICD-10-CM | POA: Diagnosis not present

## 2020-06-08 DIAGNOSIS — Z0000 Encounter for general adult medical examination without abnormal findings: Secondary | ICD-10-CM | POA: Diagnosis not present

## 2020-06-08 MED ORDER — BETAMETHASONE DIPROPIONATE 0.05 % EX OINT: TOPICAL_OINTMENT | Freq: Two times a day (BID) | CUTANEOUS | 0 refills | Status: DC

## 2020-06-08 MED ORDER — LISINOPRIL 20 MG PO TABS: 20.0000 mg | ORAL_TABLET | Freq: Every day | ORAL | 2 refills | Status: DC

## 2020-06-08 NOTE — Patient Instructions (Addendum)
It was nice seeing you today! Thank you for choosing [facility] for your Primary Care.    Today we talked about:   1. Eczema: The cream called Diprolene is actually one of the strongest steroid cremes. Continue to use twice a day as needed. It is also really important to use a barrier cream to help hold in moisture.   2. Blood pressure: We can continue with your current regimen at this time.   3. Cholesterol: It will be important to work on diet to help bring your cholesterol down. That means decreasing your beef (burger and steak) intake.   4. OB/GYN referral: They will be able to offer you more information on both the urine loss and options of hysterectomy.

## 2020-06-08 NOTE — Assessment & Plan Note (Signed)
BP: 135/73   Kara Ramsey states she is doing well with her current medications and has had no difficulty with addition of lisinopril.  She denies any chest pain or shortness of breath.  Assessment/plan: Repeat blood pressure today was nearly within goal.  ASCVD score of 9.2%, so we will hold off on more aggressive treatment.   -Amlodipine 5 mg daily -Lisinopril 20 mg daily -BMP ordered, patient will come back for lab only visit

## 2020-06-08 NOTE — Telephone Encounter (Signed)
Pt left appointment today without stopping by lab for BMP.  TC placed to patient, left message she call back about scheduling lab only visit on Thursday, 06/10/20 (future orders placed by PCP). SChaplin, RN,BSN

## 2020-06-08 NOTE — Progress Notes (Signed)
   CC: Eczema and HTN follow up  HPI:  [name] is a 49 y.o. with a PMHx as listed below who presents to the clinic for eczema and HTN follow up.   Please see the Encounters tab for problem-based Assessment & Plan regarding status of patient's acute and chronic conditions.  Past Medical History:  Diagnosis Date  . Asthma    Review of Systems: Review of Systems  Constitutional: Negative for chills, fever, malaise/fatigue and weight loss.  Respiratory: Negative for cough, shortness of breath and wheezing.   Cardiovascular: Negative for chest pain, palpitations and leg swelling.  Gastrointestinal: Negative for abdominal pain, diarrhea, nausea and vomiting.  Genitourinary: Negative for dysuria, flank pain, frequency, hematuria and urgency.       + incontinence with sneezing/cough  Skin:       + eczema  Neurological: Negative for dizziness and headaches.   Physical Exam:  Vitals:   06/08/20 1418 06/08/20 1455  BP: (!) 148/81 135/73  Pulse: 89 86  Temp: 99.1 F (37.3 C)   TempSrc: Oral   SpO2: 97%   Weight: 197 lb 14.4 oz (89.8 kg)   Height: 5\' 4"  (1.626 m)     Physical Exam Vitals and nursing note reviewed. Exam conducted with a chaperone present.  Constitutional:      General: She is not in acute distress.    Appearance: She is normal weight.  Cardiovascular:     Rate and Rhythm: Normal rate and regular rhythm.     Heart sounds: No murmur heard.  No gallop.   Pulmonary:     Effort: Pulmonary effort is normal. No respiratory distress.     Breath sounds: No wheezing, rhonchi or rales.  Abdominal:     General: Bowel sounds are normal. There is no distension.     Palpations: Abdomen is soft.     Tenderness: There is no abdominal tenderness.  Genitourinary:    Exam position: Lithotomy position.     Vagina: Normal. No vaginal discharge, erythema, tenderness, bleeding, lesions or prolapsed vaginal walls.     Cervix: No discharge, friability, lesion or erythema.    Musculoskeletal:     Right lower leg: No edema.     Left lower leg: No edema.  Skin:    General: Skin is warm and dry.     Comments: Dry, fissured rash on multiple digits, along the extensor and flexor regions, both directly overlying or on the sides. Non-erythematous. Non-edematous.   Neurological:     General: No focal deficit present.     Mental Status: She is alert and oriented to person, place, and time. Mental status is at baseline.  Psychiatric:        Mood and Affect: Mood normal.        Behavior: Behavior normal.    Assessment & Plan:   See Encounters Tab for problem based charting.  Patient discussed with Dr. Evette Doffing

## 2020-06-08 NOTE — Assessment & Plan Note (Signed)
Ms. Hennigan reports a several year history of small volume urine loss with sneezing, coughing or laughing. It has been bothering her somewhat, as she has to wear pads.   Assessment/plan: Consistent with stress incontinence. Will refer to OB/GYN to discuss surgical options.   - OB/GYN referral

## 2020-06-08 NOTE — Assessment & Plan Note (Addendum)
Kara Ramsey states that her eczema improved significantly with the steroid taper, but she continues to have lesions on her fingers that have been quite uncomfortable.  She is using the Diprolene ointment at least twice per day with some relief.  She has not been using any barrier creams.  She continues to use gloves daily, especially with any water activities including dishwashing.  She is having difficulty using it while showering.  Assessment/plan: Examination consistent with bilateral hand eczema.  Recommended she continue to use Diprolene twice per day for as long as she is symptomatic, in addition to starting to use a barrier cream such as Gold Bond.  Recommended she continue to use gloves as needed.  - Diprolene refilled - Dermatology visit in November 30 (it was the first available)

## 2020-06-09 NOTE — Progress Notes (Signed)
Internal Medicine Clinic Attending  Case discussed with Dr. Basaraba  At the time of the visit.  We reviewed the resident's history and exam and pertinent patient test results.  I agree with the assessment, diagnosis, and plan of care documented in the resident's note.  

## 2020-06-10 LAB — CYTOLOGY - PAP
Comment: NEGATIVE
Diagnosis: NEGATIVE
High risk HPV: NEGATIVE

## 2020-06-15 ENCOUNTER — Telehealth: Payer: Self-pay | Admitting: Internal Medicine

## 2020-06-15 NOTE — Telephone Encounter (Signed)
Attempted to call patient x 1 to inform of normal pap results. If she calls back, please let her know.

## 2020-06-16 ENCOUNTER — Telehealth: Payer: Self-pay | Admitting: Internal Medicine

## 2020-06-16 NOTE — Telephone Encounter (Signed)
Pls contact pt [phone]

## 2020-06-16 NOTE — Telephone Encounter (Signed)
Return pt's call - stated Dr Charleen Kirks had called her yesterday. Informed pt her pap result was normal per Dr Charleen Kirks. "Attempted to call patient x 1 to inform of normal pap results. If she calls back, please let her know." Stated thank you.

## 2020-06-16 NOTE — Telephone Encounter (Signed)
Thank you Glenda... 

## 2020-06-20 ENCOUNTER — Other Ambulatory Visit: Payer: Self-pay | Admitting: Internal Medicine

## 2020-06-20 DIAGNOSIS — I10 Essential (primary) hypertension: Secondary | ICD-10-CM

## 2020-08-20 ENCOUNTER — Encounter: Payer: 59 | Admitting: Obstetrics and Gynecology

## 2020-08-20 ENCOUNTER — Encounter: Payer: Self-pay | Admitting: Obstetrics and Gynecology

## 2020-08-21 NOTE — Progress Notes (Signed)
Patient did not keep her GYN referral appointment for 08/20/2020.  Durene Romans MD Attending Center for [employer])

## 2020-08-23 NOTE — Addendum Note (Signed)
Addended by: Yvonna Alanis E on: 08/23/2020 01:30 PM   Modules accepted: Orders

## 2020-08-27 ENCOUNTER — Other Ambulatory Visit: Payer: Self-pay | Admitting: Internal Medicine

## 2020-10-12 ENCOUNTER — Other Ambulatory Visit: Payer: Self-pay | Admitting: Internal Medicine

## 2020-10-12 DIAGNOSIS — I10 Essential (primary) hypertension: Secondary | ICD-10-CM

## 2021-02-26 ENCOUNTER — Other Ambulatory Visit: Payer: Self-pay | Admitting: Internal Medicine

## 2021-02-26 DIAGNOSIS — L309 Dermatitis, unspecified: Secondary | ICD-10-CM

## 2021-03-15 ENCOUNTER — Other Ambulatory Visit: Payer: Self-pay | Admitting: Student

## 2021-03-15 ENCOUNTER — Other Ambulatory Visit: Payer: Self-pay | Admitting: Internal Medicine

## 2021-03-15 DIAGNOSIS — I10 Essential (primary) hypertension: Secondary | ICD-10-CM

## 2021-04-05 ENCOUNTER — Encounter: Payer: 59 | Admitting: Student

## 2021-04-19 ENCOUNTER — Ambulatory Visit (INDEPENDENT_AMBULATORY_CARE_PROVIDER_SITE_OTHER): Payer: BLUE CROSS/BLUE SHIELD | Admitting: Internal Medicine

## 2021-04-19 VITALS — BP 141/87 | HR 76 | Temp 98.3°F | Ht 64.0 in | Wt 194.0 lb

## 2021-04-19 DIAGNOSIS — I10 Essential (primary) hypertension: Secondary | ICD-10-CM | POA: Diagnosis not present

## 2021-04-19 DIAGNOSIS — R7303 Prediabetes: Secondary | ICD-10-CM | POA: Diagnosis not present

## 2021-04-19 DIAGNOSIS — N951 Menopausal and female climacteric states: Secondary | ICD-10-CM

## 2021-04-19 DIAGNOSIS — L309 Dermatitis, unspecified: Secondary | ICD-10-CM

## 2021-04-19 LAB — POCT GLYCOSYLATED HEMOGLOBIN (HGB A1C): Hemoglobin A1C: 5.7 % — AB (ref 4.0–5.6)

## 2021-04-19 MED ORDER — BETAMETHASONE DIPROPIONATE 0.05 % EX OINT: TOPICAL_OINTMENT | CUTANEOUS | 0 refills | Status: DC

## 2021-04-19 MED ORDER — AMLODIPINE BESYLATE 10 MG PO TABS: 10.0000 mg | ORAL_TABLET | Freq: Every day | ORAL | 11 refills | Status: DC

## 2021-04-19 MED ORDER — LISINOPRIL 20 MG PO TABS: 20.0000 mg | ORAL_TABLET | Freq: Every day | ORAL | 5 refills | Status: DC

## 2021-04-19 NOTE — Assessment & Plan Note (Signed)
[name] states that for the last several months, her menstrual cycle has become irregular and heavier.  She notes that she is also become more irritable.  She is concerned that she is entering menopause.  Assessment/plan: I discussed the typical timeline of perimenopause.  Kara Ramsey would like to follow-up with OB/GYN.  -Referral to OB/GYN placed

## 2021-04-19 NOTE — Patient Instructions (Addendum)
It was nice seeing you today! Thank you for choosing [facility] for your Primary Care.    Today we talked about:   Blood Pressure: We can continue your current medications. I would work on decreasing salt intake to help bring your blood pressure down a bit more.   Eczema: Continue using the cream only as needed. Try to avoid using it daily. I encourage you to call the dermatologist to discuss those injections further.   Menstrual cycle changes: This is may be signs that you are peri-menopausal. I have placed a referral to the OBGYN.    I will be available in the clinic again in November 2022 and March 2023.  Please make an appointment in November if possible.

## 2021-04-19 NOTE — Assessment & Plan Note (Addendum)
Since her last visit, Kara Ramsey states that she has followed up with dermatology who started her on betamethasone dipropionate ointment.  It has helped her symptoms most days and she tries to only use it on her severe days, but this is almost every other day.  She notes that the dermatologist offered injection therapy which she declined at that time.  Assessment/plan: We extensively discussed that given her eczema burden and its effect on her quality of life, she may be a good benefit for injection therapy.  Kara Ramsey agreed to contact her dermatologist office to make an appointment.  For now we will refill her ointment.  -Refilled betamethasone dipropionate 0.05 percentage ointment, use once daily as needed however avoid consecutive daily use  ADDENDUM:  New referral placed as patient's previous dermatologist is out of network now.

## 2021-04-19 NOTE — Assessment & Plan Note (Signed)
BP: (!) 141/87  Ms. Ury states that she continues to take her amlodipine and lisinopril daily without any difficulty.  She denies any chest pain, palpitations, lower extremity swelling.  Assessment/plan: Patient's blood pressure is acceptable at this time although still above goal (130/80).  We discussed continuing current medication regimen and working on decreasing her daily salt intake.  - Continue amlodipine 10 mg, lisinopril 20 mg daily - Decrease salt to less than 2000 mg/day - BMP pending

## 2021-04-19 NOTE — Progress Notes (Signed)
   CC: HTN, eczema  HPI:  Kara Ramsey is a 50 y.o. with a PMHx as listed below who presents to the clinic for HTN, eczema.   Please see the Encounters tab for problem-based Assessment & Plan regarding status of patient's acute and chronic conditions.  Past Medical History:  Diagnosis Date   Asthma    Review of Systems: Review of Systems  Constitutional:  Negative for chills, fever, malaise/fatigue and weight loss.  Respiratory:  Negative for shortness of breath and wheezing.   Cardiovascular:  Negative for chest pain, palpitations and leg swelling.  Gastrointestinal:  Negative for abdominal pain, constipation, diarrhea, nausea and vomiting.  Skin:  Positive for itching.  Neurological:  Negative for dizziness, focal weakness, weakness and headaches.   Physical Exam:  Vitals:   04/19/21 0919 04/19/21 0957  BP: (!) 148/96 (!) 141/87  Pulse: 89 76  Temp: 98.3 F (36.8 C)   TempSrc: Oral   SpO2: 99%   Weight: 194 lb (88 kg)   Height: 5\' 4"  (1.626 m)    Physical Exam Vitals and nursing note reviewed.  Constitutional:      General: She is not in acute distress.    Appearance: She is obese.  HENT:     Head: Normocephalic and atraumatic.     Mouth/Throat:     Mouth: Mucous membranes are moist.     Pharynx: Oropharynx is clear.  Eyes:     Extraocular Movements: Extraocular movements intact.     Pupils: Pupils are equal, round, and reactive to light.  Cardiovascular:     Rate and Rhythm: Normal rate and regular rhythm.     Heart sounds: No murmur heard. Pulmonary:     Effort: Pulmonary effort is normal. No respiratory distress.     Breath sounds: Normal breath sounds. No wheezing, rhonchi or rales.  Musculoskeletal:     Right lower leg: No edema.     Left lower leg: No edema.  Skin:    General: Skin is warm and dry.     Findings: Rash (Rash present on bilateral palmar surfaces consistent with patient's history of eczema.  No pustular lesions) present.   Neurological:     General: No focal deficit present.     Mental Status: She is alert and oriented to person, place, and time. Mental status is at baseline.     Gait: Gait normal.  Psychiatric:        Mood and Affect: Mood normal.        Behavior: Behavior normal.        Thought Content: Thought content normal.        Judgment: Judgment normal.    Assessment & Plan:   See Encounters Tab for problem based charting.  Patient discussed with Dr. Daryll Drown

## 2021-04-19 NOTE — Assessment & Plan Note (Signed)
Previous history of prediabetes with an A1c of 5.7%.  A1c was reevaluated today and is unchanged at 5.7% given stability, next check due in 3 years.

## 2021-04-20 LAB — BMP8+ANION GAP
Anion Gap: 22 mmol/L — ABNORMAL HIGH (ref 10.0–18.0)
BUN/Creatinine Ratio: 18 (ref 9–23)
BUN: 12 mg/dL (ref 6–24)
CO2: 22 mmol/L (ref 20–29)
Calcium: 10.1 mg/dL (ref 8.7–10.2)
Chloride: 97 mmol/L (ref 96–106)
Creatinine, Ser: 0.67 mg/dL (ref 0.57–1.00)
Glucose: 92 mg/dL (ref 65–99)
Potassium: 4.3 mmol/L (ref 3.5–5.2)
Sodium: 141 mmol/L (ref 134–144)
eGFR: 108 mL/min/1.73
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
## 2021-04-21 NOTE — Progress Notes (Signed)
Internal Medicine Clinic Attending  Case discussed with Dr. Basaraba  At the time of the visit.  We reviewed the resident's history and exam and pertinent patient test results.  I agree with the assessment, diagnosis, and plan of care documented in the resident's note.  

## 2021-04-28 NOTE — Progress Notes (Signed)
Renal function and electrolytes stable. No further intervention at this time. Will continue to monitor periodically.   Attempted to contact patient to relay results. No answer. VM unable to be left as mailbox was full.

## 2021-05-26 ENCOUNTER — Telehealth: Payer: Self-pay | Admitting: Internal Medicine

## 2021-05-26 NOTE — Addendum Note (Signed)
Addended by: Jose Persia on: 05/26/2021 06:44 PM   Modules accepted: Orders

## 2021-05-26 NOTE — Telephone Encounter (Signed)
Pt called to request a New Dermatology Referral.  Patient states she discussed on 04/19/2021 about going to see her dermatologist but, her [employer] ins is now out of network.    Please advise if a new referral can be placed.

## 2021-07-28 ENCOUNTER — Telehealth: Payer: Self-pay | Admitting: *Deleted

## 2021-08-30 NOTE — Telephone Encounter (Signed)
Error message

## 2021-11-09 ENCOUNTER — Encounter: Payer: Self-pay | Admitting: Student

## 2021-11-09 ENCOUNTER — Ambulatory Visit (INDEPENDENT_AMBULATORY_CARE_PROVIDER_SITE_OTHER): Payer: BLUE CROSS/BLUE SHIELD | Admitting: Student

## 2021-11-09 DIAGNOSIS — L309 Dermatitis, unspecified: Secondary | ICD-10-CM | POA: Diagnosis not present

## 2021-11-09 MED ORDER — BETAMETHASONE DIPROPIONATE 0.05 % EX CREA: TOPICAL_CREAM | Freq: Two times a day (BID) | CUTANEOUS | 1 refills | Status: DC

## 2021-11-09 NOTE — Patient Instructions (Addendum)
Thank you, [name] for allowing [place] to provide your care today. Today we discussed your exzema. I am sending you a different cream to use until you follow up with a dermatologist. Keep your hands moisturized as much as possible.    I have ordered the following medication/changed the following medications:  Betamethasone cream 0.05%, apply to hands twice daily  My Chart Access: [email or web address]  Please follow-up as needed  Please make sure to arrive 15 minutes prior to your next appointment. If you arrive late, you may be asked to reschedule.    We look forward to seeing you next time. Please call our clinic at [phone] if you have any questions or concerns. The best time to call is Monday-Friday from 9am-4pm, but there is someone available 24/7. If after hours or the weekend, call the main hospital number and ask for the Internal Medicine Resident On-Call. If you need medication refills, please notify your pharmacy one week in advance and they will send [place] a request.   Thank you for letting us take part in your care. Wishing you the best!  Kara Axon, MD 11/09/2021, 11:21 AM IM Resident, PGY-2 [facility] 41:10

## 2021-11-09 NOTE — Progress Notes (Signed)
° °  CC: Follow-up on eczema  HPI:  [name] is a 51 y.o. female with PMH as below who presents to clinic for follow-up on her hand eczema. Please see problem based charting for evaluation, assessment and plan.  Past Medical History:  Diagnosis Date   Asthma     Review of Systems:  Constitutional: Negative for fever or fatigue MSK: Negative for back pain Skin: Positive for rash and itching. Negative for bruises Neuro: Negative for headache, dizziness  Physical Exam: General: Pleasant, well-nourished middle-age woman.  No acute distress. Cardiac: RRR. No murmurs, rubs or gallops. Respiratory: Lungs CTAB. No wheezing or crackles. Skin: Warm, dry. Pruritic, scaling rash in the palmar aspect of bilateral hands with dry and cracked interdigits. (See media tab) Extremities: Atraumatic. Full ROM. Palpable radial and DP pulses. Neuro: A&O x 3. Moves all extremities.  Psych: Appropriate mood and affect.  Vitals:   11/09/21 1019 11/09/21 1111  BP: (!) 142/82 132/85  Pulse: 89 87  Temp: 98.2 F (36.8 C)   TempSrc: Oral   SpO2: 99%   Weight: 199 lb (90.3 kg)   Height: 5\' 4"  (1.626 m)      Assessment & Plan:   See Encounters Tab for problem based charting.  Patient discussed with Dr. Lockie Pares, MD, MPH

## 2021-11-09 NOTE — Assessment & Plan Note (Signed)
Patient here for follow-up on her hand eczema.  Reports that she was evaluated at the urgent care in November for worsening rash and itching in her hands.  She was prescribed triamcinolone cream which has not significantly improved her rash.  She does report some improvement with the itching.  She continues to have cracked, warm and dry hands.  She cooks a lot for her family causing her to have more cracks between her fingers.  She is waiting for her insurance to change in the next few weeks so she can follow up with dermatology for further evaluation.  She has ran out of her triamcinolone and would like a better steroids.  Patient prefers the cream as this is not a sticky as the ointments.  Plan to restart patient back on higher potency steroids such as betamethasone.  Plan: --Start betamethasone dipropionate 0.5% cream --Advised to moisturize hand as much as possible --Follow-up with dermatology

## 2021-11-10 NOTE — Progress Notes (Signed)
Internal Medicine Clinic Attending  Case discussed with Dr. Amponsah  At the time of the visit.  We reviewed the resident's history and exam and pertinent patient test results.  I agree with the assessment, diagnosis, and plan of care documented in the resident's note.  

## 2021-11-16 ENCOUNTER — Encounter: Payer: Self-pay | Admitting: Student

## 2021-11-16 ENCOUNTER — Ambulatory Visit (INDEPENDENT_AMBULATORY_CARE_PROVIDER_SITE_OTHER): Payer: BLUE CROSS/BLUE SHIELD | Admitting: Student

## 2021-11-16 DIAGNOSIS — L309 Dermatitis, unspecified: Secondary | ICD-10-CM

## 2021-11-16 MED ORDER — PREDNISONE 5 MG PO TABS: 20.0000 mg | ORAL_TABLET | Freq: Every day | ORAL | 0 refills | Status: AC

## 2021-11-16 NOTE — Progress Notes (Signed)
° °  CC: Hand eczema  HPI:  [name] is a 51 y.o. female with PMH as below who presents for follow-up on her hand eczema. Please see problem based charting for evaluation, assessment and plan.  Past Medical History:  Diagnosis Date   Asthma     Review of Systems:  Constitutional: Negative for fever or fatigue Skin: Positive for rash and itching.  Negative for bruises MSK: Negative for back pain or joint pains   Physical Exam: General: Pleasant, well-nourished middle-age woman.  No acute distress. Cardiac: RRR. No murmurs, rubs or gallops. Respiratory: Lungs CTAB. No wheezing or crackles. Skin: Warm, dry. Pruritic, scaling rash in the palmar aspect of bilateral hands with dry and cracked interdigits. (See media tab) Neuro: A&O x 3. Moves all extremities.    Vitals:   11/16/21 1002 11/16/21 1005  BP:  140/83  Pulse:  93  Temp:  98.1 F (36.7 C)  TempSrc:  Oral  SpO2: 100% 100%  Weight: 197 lb 14.4 oz (89.8 kg) 197 lb 14.4 oz (89.8 kg)  Height: 5\' 4"  (1.626 m) 5\' 4"  (1.626 m)     Assessment & Plan:   See Encounters Tab for problem based charting.  Patient discussed with Dr. Lockie Pares, MD, MPH

## 2021-11-16 NOTE — Patient Instructions (Signed)
Thank you, Ms.Amedeo Plenty for allowing [place] to provide your care today. Today we discussed your hand eczema.  It is important to continue using the steroid cream as well as barrier cream such as ointments and emollients.  You can use the Imodium supplements up to 3-4 times a day.  I am starting you on a short course of prednisone however at this point to continue the creams and wearing gloves daily to help improve the symptoms.   I have ordered the following medication/changed the following medications:  Prednisone 20 mg daily for 5 days.  My Chart Access: [email or web address]  Please follow-up as needed.  Please make sure to arrive 15 minutes prior to your next appointment. If you arrive late, you may be asked to reschedule.    We look forward to seeing you next time. Please call our clinic at [phone] if you have any questions or concerns. The best time to call is Monday-Friday from 9am-4pm, but there is someone available 24/7. If after hours or the weekend, call the main hospital number and ask for the Internal Medicine Resident On-Call. If you need medication refills, please notify your pharmacy one week in advance and they will send [place] a request.   Thank you for letting us take part in your care. Wishing you the best!  Lacinda Axon, MD 11/16/2021, 11:18 AM IM Resident, PGY-2 Oswaldo Milian 41:10

## 2021-11-17 ENCOUNTER — Encounter: Payer: Self-pay | Admitting: Student

## 2021-11-17 ENCOUNTER — Telehealth: Payer: Self-pay

## 2021-11-17 NOTE — Progress Notes (Signed)
Internal Medicine Clinic Attending  Case discussed with Dr. Amponsah  At the time of the visit.  We reviewed the resident's history and exam and pertinent patient test results.  I agree with the assessment, diagnosis, and plan of care documented in the resident's note.  

## 2021-11-17 NOTE — Telephone Encounter (Signed)
Returned call to patient. States she was told not to use betamethasone cream on her hands more that twice daily but she doesn't remember why. Please advise.

## 2021-11-17 NOTE — Assessment & Plan Note (Signed)
Patient reports for a 1 week follow-up on her hand dermatitis.  Patient reports that her symptoms have not improved on the current cortisone cream.  She continues to wear gloves to keep her hands away from water and has been using moisturizers, barrier cream such as Vaseline to keep the hand moist. The steroid cream gives her temporary relief of the itching but this returns after a few hours.  Patient reports she has tried systemic therapy with prednisone multiple times in the past which gave her some relief for about 4 weeks. She has an appointment with a dermatologist in April but is waiting for her insurance card so she can try to see another dermatologist sooner. We will try a short course of prednisone as patient waits to see dermatology. Patient counseled on the importance of keeping the hand away from water as much as possible and using emollients and other barrier creams consistently.  Patient also counseled that these interventions will not completely take away her symptoms and unfortunately will have to find some way to work around them.  Plan: -- Prednisone 20 mg daily for 5 days -- Continue betamethasone dipropionate 0.5% cream, apply to hands daily -- Continue barrier cream such as Vaseline, apply to hands multiple times a day and wear gloves daily -- Follow-up with dermatology

## 2021-11-17 NOTE — Telephone Encounter (Signed)
Requesting to speak with a nurse about hand cream, please call pt back.

## 2021-11-17 NOTE — Telephone Encounter (Signed)
Patient has been called the questions have been answered.

## 2021-11-24 ENCOUNTER — Encounter: Payer: Self-pay | Admitting: *Deleted

## 2021-12-19 ENCOUNTER — Encounter: Payer: BLUE CROSS/BLUE SHIELD | Admitting: Internal Medicine

## 2021-12-20 ENCOUNTER — Encounter: Payer: Self-pay | Admitting: Internal Medicine

## 2022-01-06 ENCOUNTER — Encounter (HOSPITAL_COMMUNITY): Payer: Self-pay | Admitting: Emergency Medicine

## 2022-01-06 ENCOUNTER — Emergency Department (HOSPITAL_COMMUNITY)
Admission: EM | Admit: 2022-01-06 | Discharge: 2022-01-06 | Disposition: A | Discharge status: Home / Self Care | Payer: BLUE CROSS/BLUE SHIELD | Attending: Emergency Medicine | Admitting: Emergency Medicine

## 2022-01-06 DIAGNOSIS — R21 Rash and other nonspecific skin eruption: Secondary | ICD-10-CM | POA: Diagnosis present

## 2022-01-06 MED ORDER — PREDNISONE 20 MG PO TABS: ORAL_TABLET | ORAL | 0 refills | Status: DC

## 2022-01-06 MED ORDER — PREDNISONE 20 MG PO TABS
60.0000 mg | ORAL_TABLET | Freq: Once | ORAL | Status: AC
  Administered 2022-01-06: 60 mg via ORAL
  Filled 2022-01-06: qty 3

## 2022-01-06 NOTE — ED Provider Notes (Signed)
?[facility] ?Provider Note ? ? ?CSN: [id number] ?Arrival date & time: 01/06/22  2297 ? ?  ? ?History ? ?Chief Complaint  ?Patient presents with  ? Eczema  ? ? ?Kara Ramsey is a 51 y.o. female. ? ?51 yo F with a cc of a rash to both hands.  Thought to be eczema.  On steroid cream.  Not improving, feels like she needs systemic steroids.   ? ? ? ?  ? ?Home Medications ?Prior to Admission medications   ?Medication Sig Start Date End Date Taking? Authorizing Provider  ?predniSONE (DELTASONE) 20 MG tablet 2 tabs po daily x 4 days 01/06/22  Yes Deno Etienne, DO  ?amLODipine (NORVASC) 10 MG tablet Take 1 tablet (10 mg total) by mouth daily. 04/19/21 04/14/22  Jose Persia, MD  ?betamethasone dipropionate 0.05 % cream Apply topically 2 (two) times daily. 11/09/21   Lacinda Axon, MD  ?lisinopril (ZESTRIL) 20 MG tablet Take 1 tablet (20 mg total) by mouth daily. 04/19/21   Jose Persia, MD  ?   ? ?Allergies    ?Patient has no known allergies.   ? ?Review of Systems   ?Review of Systems ? ?Physical Exam ?Updated Vital Signs ?BP (!) 177/115 (BP Location: Right Arm)   Pulse 92   Temp 98.7 ?F (37.1 ?C) (Oral)   Resp 14   SpO2 98%  ?Physical Exam ?Vitals and nursing note reviewed.  ?Constitutional:   ?   General: She is not in acute distress. ?   Appearance: She is well-developed. She is not diaphoretic.  ?HENT:  ?   Head: Normocephalic and atraumatic.  ?Eyes:  ?   Pupils: Pupils are equal, round, and reactive to light.  ?Cardiovascular:  ?   Rate and Rhythm: Normal rate and regular rhythm.  ?   Heart sounds: No murmur heard. ?  No friction rub. No gallop.  ?Pulmonary:  ?   Effort: Pulmonary effort is normal.  ?   Breath sounds: No wheezing or rales.  ?Abdominal:  ?   General: There is no distension.  ?   Palpations: Abdomen is soft.  ?   Tenderness: There is no abdominal tenderness.  ?Musculoskeletal:     ?   General: No tenderness.  ?   Cervical back: Normal range of motion and  neck supple.  ?Skin: ?   General: Skin is warm and dry.  ?   Findings: Rash present.  ?   Comments: Erythema and chronic skin changes to bilateral hands.    ?Neurological:  ?   Mental Status: She is alert and oriented to person, place, and time.  ?Psychiatric:     ?   Behavior: Behavior normal.  ? ? ?ED Results / Procedures / Treatments   ?Labs ?(all labs ordered are listed, but only abnormal results are displayed) ?Labs Reviewed - No data to display ? ?EKG ?None ? ?Radiology ?No results found. ? ?Procedures ?Procedures  ? ? ?Medications Ordered in ED ?Medications  ?predniSONE (DELTASONE) tablet 60 mg (has no administration in time range)  ? ? ?ED Course/ Medical Decision Making/ A&P ?  ?                        ?Medical Decision Making ?Risk ?Prescription drug management. ? ? ?51 yo F with a cc of a rash to bilateral hands.  Going on for some time, hx of eczema, feeling worse despite betamethasone cream.  Will start on  systemic steroids.  PCP follow up.  ? ?9:55 AM:  I have discussed the diagnosis/risks/treatment options with the patient.  Evaluation and diagnostic testing in the emergency department does not suggest an emergent condition requiring admission or immediate intervention beyond what has been performed at this time.  They will follow up with  PCP. We also discussed returning to the ED immediately if new or worsening sx occur. We discussed the sx which are most concerning (e.g., sudden worsening pain, fever, inability to tolerate by mouth) that necessitate immediate return. Medications administered to the patient during their visit and any new prescriptions provided to the patient are listed below. ? ?Medications given during this visit ?Medications  ?predniSONE (DELTASONE) tablet 60 mg (has no administration in time range)  ? ? ? ?The patient appears reasonably screen and/or stabilized for discharge and I doubt any other medical condition or other [facility] requiring further screening, evaluation, or treatment  in the ED at this time prior to discharge.  ? ? ? ? ? ? ? ? ?Final Clinical Impression(s) / ED Diagnoses ?Final diagnoses:  ?Rash  ? ? ?Rx / DC Orders ?ED Discharge Orders   ? ?      Ordered  ?  predniSONE (DELTASONE) 20 MG tablet       ? 01/06/22 8101  ? ?  ?  ? ?  ? ? ?  ?Deno Etienne, DO ?01/06/22 7510 ? ?

## 2022-01-06 NOTE — ED Triage Notes (Signed)
Patient with history of eczema here with complaint of flare up on bilateral hands. States she saw her PCP and was prescribed betamethasone cream but is here because she thinks she needs either an injection or PO steroid. Patient is alert, oriented, ambulatory, and in no apparent distress at this time. Patient is trying to see a dermatologist but earliest appointment available at provider that accepts her insurance is at the end of this year. ?

## 2022-01-06 NOTE — ED Notes (Signed)
Pt has been dealing with eczema on hands for a lengthy period of time- has not been able to get in to see a dermatologist. Hands are cracking and peeling. Has been wearing gloves to keep lotion/ A&D ointment/Eucerin on hands.  ?

## 2022-01-06 NOTE — Discharge Instructions (Addendum)
Please take the steroids as prescribed.  Please let your family doctor know that you came to the emergency department to be evaluated.  Unfortunately dermatology is a specialty that does not often have a quick turnaround for outpatient care.  The 2 closest medical centers with dermatology residencies to my knowledge are at [facility] and [facility].  You could always try to go there if things got worse and then depending on the provider they may decide to have a few urgently sent to dermatology or have them see you in the emergency department. ? ?[facility] ?[address], [place], [place] [address] ? ?[facility] [address] ?[place] [address] ? ? ? ?

## 2022-04-18 ENCOUNTER — Other Ambulatory Visit: Payer: Self-pay | Admitting: Internal Medicine

## 2022-04-18 DIAGNOSIS — I10 Essential (primary) hypertension: Secondary | ICD-10-CM

## 2023-03-13 ENCOUNTER — Encounter: Payer: Self-pay | Admitting: *Deleted

## 2023-04-27 ENCOUNTER — Encounter (HOSPITAL_COMMUNITY): Payer: Self-pay

## 2023-04-27 ENCOUNTER — Ambulatory Visit (INDEPENDENT_AMBULATORY_CARE_PROVIDER_SITE_OTHER): Payer: BLUE CROSS/BLUE SHIELD

## 2023-04-27 ENCOUNTER — Ambulatory Visit (HOSPITAL_COMMUNITY)
Admission: EM | Admit: 2023-04-27 | Discharge: 2023-04-27 | Disposition: A | Discharge status: Home / Self Care | Payer: BLUE CROSS/BLUE SHIELD | Attending: Internal Medicine | Admitting: Internal Medicine

## 2023-04-27 DIAGNOSIS — M791 Myalgia, unspecified site: Secondary | ICD-10-CM | POA: Diagnosis not present

## 2023-04-27 DIAGNOSIS — W19XXXA Unspecified fall, initial encounter: Secondary | ICD-10-CM

## 2023-04-27 DIAGNOSIS — M25561 Pain in right knee: Secondary | ICD-10-CM

## 2023-04-27 HISTORY — DX: Dermatitis, unspecified: L30.9

## 2023-04-27 MED ORDER — ACETAMINOPHEN 325 MG PO TABS
975.0000 mg | ORAL_TABLET | Freq: Once | ORAL | Status: AC
  Administered 2023-04-27: 975 mg via ORAL

## 2023-04-27 MED ORDER — KETOROLAC TROMETHAMINE 30 MG/ML IJ SOLN
30.0000 mg | Freq: Once | INTRAMUSCULAR | Status: AC
  Administered 2023-04-27: 30 mg via INTRAMUSCULAR

## 2023-04-27 NOTE — ED Triage Notes (Signed)
Patient here today after having a fall on Wednesday and injuring her LB and right knee. Patient states that she has increased pain weightbearing on her right knee. Her back pain is radiating down her left leg. Patient was in [employer] and slipped on a wet spot on the floor. Her right foot went back and her right knee hit the floor and she fell backwards.

## 2023-04-27 NOTE — Discharge Instructions (Signed)
Your x-rays of your right knee were negative for fracture or dislocation. You likely sprained your right knee.   Wear the knee brace we provided in the clinic for the next couple of weeks to provide compression, stability, and comfort.  Please rest, ice, and elevate your knee to help it heal and decrease inflammation.   Take 600mg  ibuprofen and/or 1,000mg  tylenol every 6 hours as needed for pain and inflammation. Take with food to avoid stomach upset. Do not take any ibuprofen until tomorrow since I gave you medicine in the clinic for your pain via shot.  Call the orthopedic provider listed on your discharge paperwork to schedule a follow-up appointment if your symptoms do not improve in the next 1-2 weeks with supportive care.  Return to urgent care if you experience worsening pain, numbness, tingling, change of color in your skin near the injury, or any other concerning symptoms.  I hope you feel better!

## 2023-04-27 NOTE — ED Provider Notes (Signed)
[facility]    CSN: [id number] Arrival date & time: 04/27/23  1426      History   Chief Complaint Chief Complaint  Patient presents with   Fall    HPI Kara Ramsey is a 52 y.o. female.   Patient presents to urgent care for evaluation of right knee pain, low back discomfort, and left leg discomfort/soreness as a result of fall that happened 2 days ago on Wednesday, April 25, 2023.  States she was shopping in [employer] and did not notice a puddle of water on the floor in her walking path.  She slipped and fell on the puddle of water landing onto her buttocks with her left leg straight out in front of her and her right knee hitting the floor behind her/out to the side.  She was able to stand up immediately and has been ambulatory since despite discomfort to the right knee.  States she has been "hobbling around the house".  Pain to the right knee is worsened by weightbearing activity.  No numbness or tingling distally to lower extremities or extremity weakness as a result of fall.  No loss of bowel/bladder continence, saddle anesthesia symptoms, or dizziness.  She did not hit her head during the fall and denies LOC. Pain is most intense to the anterior aspect of the right knee.  Denies previous injury to the right knee in the past.  She has been icing the right knee and states that this helped a lot with the swelling but has not helped very much with the pain.  She has not attempted use of any over-the-counter medications before coming to urgent care for pain.   Fall    Past Medical History:  Diagnosis Date   Asthma    Eczema     Patient Active Problem List   Diagnosis Date Noted   Peri-menopause 04/19/2021   Pre-diabetes 04/19/2021   Stress incontinence 06/08/2020   Chronic dermatitis of hands 04/30/2020   Essential hypertension 03/31/2020   Healthcare maintenance 03/31/2020    Past Surgical History:  Procedure Laterality Date   TUBAL LIGATION      OB History    No obstetric history on file.      Home Medications    Prior to Admission medications   Medication Sig Start Date End Date Taking? Authorizing Provider  amLODipine (NORVASC) 10 MG tablet TAKE 1 TABLET BY MOUTH EVERY DAY 04/21/22  Yes Doran Stabler, DO  betamethasone dipropionate 0.05 % cream Apply topically 2 (two) times daily. 11/09/21  Yes Steffanie Rainwater, MD  lisinopril (ZESTRIL) 20 MG tablet TAKE 1 TABLET BY MOUTH EVERY DAY 04/21/22  Yes Doran Stabler, DO  predniSONE (DELTASONE) 20 MG tablet 2 tabs po daily x 4 days 01/06/22   Melene Plan, DO    Family History Family History  Family history unknown: Yes    Social History Social History   Tobacco Use   Smoking status: Every Day   Smokeless tobacco: Current  Vaping Use   Vaping status: Never Used  Substance Use Topics   Alcohol use: Yes   Drug use: Yes    Types: Marijuana     Allergies   Patient has no known allergies.   Review of Systems Review of Systems Per HPI  Physical Exam Triage Vital Signs ED Triage Vitals  Encounter Vitals Group     BP 04/27/23 1444 (!) 166/103     Systolic BP Percentile --      Diastolic BP Percentile --  Pulse Rate 04/27/23 1444 83     Resp 04/27/23 1444 16     Temp 04/27/23 1444 98.7 F (37.1 C)     Temp Source 04/27/23 1444 Oral     SpO2 04/27/23 1444 93 %     Weight 04/27/23 1444 188 lb (85.3 kg)     Height 04/27/23 1444 5\' 4"  (1.626 m)     Head Circumference --      Peak Flow --      Pain Score 04/27/23 1440 5     Pain Loc --      Pain Education --      Exclude from Growth Chart --    No data found.  Updated Vital Signs BP (!) 166/103 (BP Location: Right Arm)   Pulse 83   Temp 98.7 F (37.1 C) (Oral)   Resp 16   Ht 5\' 4"  (1.626 m)   Wt 188 lb (85.3 kg)   LMP 03/26/2023 (Approximate)   SpO2 93%   BMI 32.27 kg/m   Visual Acuity Right Eye Distance:   Left Eye Distance:   Bilateral Distance:    Right Eye Near:   Left Eye Near:    Bilateral Near:      Physical Exam Vitals and nursing note reviewed.  Constitutional:      Appearance: She is not ill-appearing or toxic-appearing.  HENT:     Head: Normocephalic and atraumatic.     Right Ear: Hearing and external ear normal.     Left Ear: Hearing and external ear normal.     Nose: Nose normal.     Mouth/Throat:     Lips: Pink.  Eyes:     General: Lids are normal. Vision grossly intact. Gaze aligned appropriately.     Extraocular Movements: Extraocular movements intact.     Conjunctiva/sclera: Conjunctivae normal.  Pulmonary:     Effort: Pulmonary effort is normal.  Musculoskeletal:     Cervical back: Neck supple.     Right knee: No swelling, deformity, effusion, erythema, ecchymosis, lacerations, bony tenderness or crepitus. Normal range of motion. Tenderness present over the medial joint line, lateral joint line and patellar tendon. Normal alignment, normal meniscus and normal patellar mobility. Normal pulse.     Left knee: Normal.     Comments: Strength and sensation intact to bilateral lower extremities with flexion and extension against resistance.  Sensation intact distally to injury.  +2 bilateral popliteal pulses.  Skin:    General: Skin is warm and dry.     Capillary Refill: Capillary refill takes less than 2 seconds.     Findings: No rash.  Neurological:     General: No focal deficit present.     Mental Status: She is alert and oriented to person, place, and time. Mental status is at baseline.     Cranial Nerves: Cranial nerves 2-12 are intact. No dysarthria or facial asymmetry.     Sensory: Sensation is intact.     Motor: Motor function is intact.     Coordination: Coordination is intact.     Gait: Gait abnormal (Antalgic gait favoring right lower extremity secondary to pain to the right knee).  Psychiatric:        Mood and Affect: Mood normal.        Speech: Speech normal.        Behavior: Behavior normal.        Thought Content: Thought content normal.         Judgment: Judgment normal.  UC Treatments / Results  Labs (all labs ordered are listed, but only abnormal results are displayed) Labs Reviewed - No data to display  EKG   Radiology DG Knee Complete 4 Views Right  Result Date: 04/27/2023 CLINICAL DATA:  Fall, pain EXAM: RIGHT KNEE - COMPLETE 4+ VIEW COMPARISON:  None Available. FINDINGS: No fracture or dislocation. Small knee joint effusion. No evidence of arthropathy or other focal bone abnormality. Soft tissues are unremarkable. IMPRESSION: No fracture or dislocation of the right knee. Small knee joint effusion. Electronically Signed   By: Jearld Lesch M.D.   On: 04/27/2023 15:29    Procedures Procedures (including critical care time)  Medications Ordered in UC Medications  ketorolac (TORADOL) 30 MG/ML injection 30 mg (has no administration in time range)  acetaminophen (TYLENOL) tablet 975 mg (has no administration in time range)    Initial Impression / Assessment and Plan / UC Course  I have reviewed the triage vital signs and the nursing notes.  Pertinent labs & imaging results that were available during my care of the patient were reviewed by me and considered in my medical decision making (see chart for details).   1. Fall, acute pain of right knee, myalgia Imaging is negative for acute fracture or dislocation.  We will manage this with conservative treatment as an acute sprain to the right knee as a result of fall. RICE advised.  Right knee brace/right knee sleeve applied in clinic and patient may use this as needed for compression and stability.  Ketorolac 30 mg IM and Tylenol 975 mg given in clinic for acute pain and inflammation.  May start taking ibuprofen 600 mg every 6 hours as needed for pain and inflammation tomorrow with food. Walking referral to orthopedics given should symptoms fail to improve in the next few weeks with conservative care.   Counseled patient on potential for adverse effects with medications  prescribed/recommended today, strict ER and return-to-clinic precautions discussed, patient verbalized understanding.    Final Clinical Impressions(s) / UC Diagnoses   Final diagnoses:  Fall, initial encounter  Acute pain of right knee  Myalgia     Discharge Instructions      Your x-rays of your right knee were negative for fracture or dislocation. You likely sprained your right knee.   Wear the knee brace we provided in the clinic for the next couple of weeks to provide compression, stability, and comfort.  Please rest, ice, and elevate your knee to help it heal and decrease inflammation.   Take 600mg  ibuprofen and/or 1,000mg  tylenol every 6 hours as needed for pain and inflammation. Take with food to avoid stomach upset. Do not take any ibuprofen until tomorrow since I gave you medicine in the clinic for your pain via shot.  Call the orthopedic provider listed on your discharge paperwork to schedule a follow-up appointment if your symptoms do not improve in the next 1-2 weeks with supportive care.  Return to urgent care if you experience worsening pain, numbness, tingling, change of color in your skin near the injury, or any other concerning symptoms.  I hope you feel better!     ED Prescriptions   None    PDMP not reviewed this encounter.   Carlisle Beers, [place] 04/27/23 [phone]

## 2023-07-04 ENCOUNTER — Encounter (HOSPITAL_COMMUNITY): Payer: Self-pay

## 2023-07-04 ENCOUNTER — Ambulatory Visit (HOSPITAL_COMMUNITY)
Admission: EM | Admit: 2023-07-04 | Discharge: 2023-07-04 | Disposition: A | Discharge status: Home / Self Care | Payer: BLUE CROSS/BLUE SHIELD | Attending: Family Medicine | Admitting: Family Medicine

## 2023-07-04 DIAGNOSIS — L309 Dermatitis, unspecified: Secondary | ICD-10-CM | POA: Diagnosis not present

## 2023-07-04 DIAGNOSIS — F17200 Nicotine dependence, unspecified, uncomplicated: Secondary | ICD-10-CM | POA: Insufficient documentation

## 2023-07-04 DIAGNOSIS — N898 Other specified noninflammatory disorders of vagina: Secondary | ICD-10-CM | POA: Insufficient documentation

## 2023-07-04 DIAGNOSIS — M25561 Pain in right knee: Secondary | ICD-10-CM | POA: Diagnosis present

## 2023-07-04 DIAGNOSIS — N949 Unspecified condition associated with female genital organs and menstrual cycle: Secondary | ICD-10-CM | POA: Diagnosis not present

## 2023-07-04 DIAGNOSIS — G8929 Other chronic pain: Secondary | ICD-10-CM | POA: Insufficient documentation

## 2023-07-04 MED ORDER — DEXAMETHASONE SODIUM PHOSPHATE 10 MG/ML IJ SOLN
10.0000 mg | Freq: Once | INTRAMUSCULAR | Status: AC
  Administered 2023-07-04: 10 mg via INTRAMUSCULAR

## 2023-07-04 MED ORDER — DEXAMETHASONE SODIUM PHOSPHATE 10 MG/ML IJ SOLN
INTRAMUSCULAR | Status: AC
  Filled 2023-07-04: qty 1

## 2023-07-04 MED ORDER — CLOBETASOL PROPIONATE 0.05 % EX CREA: 1.0000 | TOPICAL_CREAM | Freq: Two times a day (BID) | CUTANEOUS | 1 refills | Status: DC

## 2023-07-04 NOTE — ED Provider Notes (Signed)
[facility]   [id number] 07/04/23 Arrival Time: 4098  ASSESSMENT & PLAN:  1. Chronic pain of right knee   2. Vaginal discomfort   3. Eczema of both hands    Prefers IM steroid to see if this will help with knee pain. Acute on chronic. Unclear etiology. Refilled Temovate cream at her request. Meds ordered this encounter  Medications   dexamethasone (DECADRON) injection 10 mg   clobetasol cream (TEMOVATE) 0.05 %    Sig: Apply 1 Application topically 2 (two) times daily.    Dispense:  60 g    Refill:  1   Work/school excuse note: not needed.  Vaginal cytology sent. No desire for empiric tx.  Recommend:  Follow-up Information     Call  [facility].   Specialty: Orthopedics Contact information: [address] [place] [address] [phone]        Call  [facility].   Specialty: Specialist Contact information: [address] [place] [address] [phone]                 Reviewed expectations re: course of current medical issues. Questions answered. Outlined signs and symptoms indicating need for more acute intervention. Patient verbalized understanding. After Visit Summary given.  SUBJECTIVE: History from: patient. Kara Ramsey is a 52 y.o. female who reports  1) R knee pain s/p fall about 2 months ago. Seen here. Imaging negative. Continuing superior/anterior pain, with ambulation and prolonged sitting. Wears knee brace without relief. No extremity sensation changes or weakness. Ibuprofen some help.  2) Vaginal irritation. Has f/u with gyn soon. Ques slight discharge. Afebrile without n/v. Normal PO intake. Denies abd pain.  3) Chronic eczema of hands. Would like refill of clobetasol cream   Past Surgical History:  Procedure Laterality Date   TUBAL LIGATION        OBJECTIVE:  Vitals:   07/04/23 0952  BP: (!) 164/107  Pulse: 80  Resp: 16  Temp: 98.7 F (37.1 C)   TempSrc: Oral  SpO2: 98%    General appearance: alert; no distress HEENT: ; AT Neck: supple with FROM Resp: unlabored respirations Extremities: bilateral hands with eczematous changes; R knee with TTP over superior knee at quad insertion; without swelling or bruising; normal knee ROM with reported superior pain Skin: warm and dry; no visible rashes Neurologic: gait normal; normal sensation and strength of bilateral LE Psychological: alert and cooperative; normal mood and affect   No Known Allergies  Past Medical History:  Diagnosis Date   Asthma    Eczema    Social History   Socioeconomic History   Marital status: Married    Spouse name: Not on file   Number of children: Not on file   Years of education: Not on file   Highest education level: Not on file  Occupational History   Not on file  Tobacco Use   Smoking status: Every Day   Smokeless tobacco: Current  Vaping Use   Vaping status: Never Used  Substance and Sexual Activity   Alcohol use: Yes   Drug use: Yes    Types: Marijuana   Sexual activity: Yes    Birth control/protection: Surgical  Other Topics Concern   Not on file  Social History Narrative   Not on file   Social Determinants of Health   Financial Resource Strain: Not on file  Food Insecurity: Low Risk  (06/08/2023)   Received from [facility]   Hunger Vital Sign  Worried About Programme researcher, broadcasting/film/video in the Last Year: Never true    Ran Out of Food in the Last Year: Never true  Transportation Needs: No Transportation Needs (06/08/2023)   Received from [facility]    In the past 12 months, has lack of reliable transportation kept you from medical appointments, meetings, work or from getting things needed for daily living? : No  Physical Activity: Not on file  Stress: Not on file  Social Connections: Not on file   Family History  Family history unknown: Yes   Past Surgical History:  Procedure Laterality Date   TUBAL  LIGATION         Mardella Layman, MD 07/04/23 [phone]

## 2023-07-04 NOTE — ED Triage Notes (Signed)
Pt presents to the office right knee pain since her accident.   Pt reports vaginal discharge x 2-3 days. Pt reports a mild odor.   Pt request a refill on eczema cream.

## 2023-07-05 LAB — CERVICOVAGINAL ANCILLARY ONLY
Bacterial Vaginitis (gardnerella): NEGATIVE
Candida Glabrata: NEGATIVE
Candida Vaginitis: NEGATIVE
Chlamydia: NEGATIVE
Comment: NEGATIVE
Comment: NEGATIVE
Comment: NEGATIVE
Comment: NEGATIVE
Comment: NEGATIVE
Comment: NORMAL
Neisseria Gonorrhea: NEGATIVE
Trichomonas: NEGATIVE

## 2023-10-20 ENCOUNTER — Encounter (HOSPITAL_COMMUNITY): Payer: Self-pay

## 2023-10-20 ENCOUNTER — Ambulatory Visit (HOSPITAL_COMMUNITY)
Admission: EM | Admit: 2023-10-20 | Discharge: 2023-10-20 | Disposition: A | Discharge status: Home / Self Care | Payer: BLUE CROSS/BLUE SHIELD | Attending: Internal Medicine | Admitting: Internal Medicine

## 2023-10-20 DIAGNOSIS — J038 Acute tonsillitis due to other specified organisms: Secondary | ICD-10-CM

## 2023-10-20 DIAGNOSIS — D367 Benign neoplasm of other specified sites: Secondary | ICD-10-CM

## 2023-10-20 DIAGNOSIS — L2084 Intrinsic (allergic) eczema: Secondary | ICD-10-CM

## 2023-10-20 MED ORDER — METHYLPREDNISOLONE ACETATE 40 MG/ML IJ SUSP
INTRAMUSCULAR | Status: AC
  Filled 2023-10-20: qty 1

## 2023-10-20 MED ORDER — AMOXICILLIN 500 MG PO CAPS: 500.0000 mg | ORAL_CAPSULE | Freq: Three times a day (TID) | ORAL | 0 refills | Status: DC

## 2023-10-20 MED ORDER — METHYLPREDNISOLONE ACETATE 40 MG/ML IJ SUSP
40.0000 mg | Freq: Once | INTRAMUSCULAR | Status: AC
  Administered 2023-10-20: 40 mg via INTRAMUSCULAR

## 2023-10-20 MED ORDER — CLOBETASOL PROPIONATE 0.05 % EX OINT: 1.0000 | TOPICAL_OINTMENT | Freq: Two times a day (BID) | CUTANEOUS | 6 refills | Status: AC

## 2023-10-20 NOTE — ED Triage Notes (Signed)
 Patient presents to the office for bilateral hand pain. Patient reports skin irritation for several weeks.  Patient reports nasal congestion.

## 2023-10-20 NOTE — ED Provider Notes (Signed)
 [facility]    CSN: [id number] Arrival date & time: 10/20/23  1023      History   Chief Complaint No chief complaint on file.   HPI Kara Ramsey is a 53 y.o. female.   53 y.o. female who presents to urgent care with complaints of bilateral ear pain, eczema flare on the hands and feet and area of concern on the left antecubital fossa.  She reports that the ear pain started about a week ago.  It is in both ears.  She does have a sore throat as well.  She has had subjective fevers and chills.  She reports that around the same time both hands and feet became very dry and irritated.  She has a history of eczema and she believes that this has flared up due to her ears.  She has been using clobetasol  cream but is running out.  She has seen dermatology but it has been a while as it is difficult to get in for appointments.  She has gotten steroid injections in the past to help with this.  She also relates that about a month ago she hit her left arm on the edge of a board and she has had a small swollen area since then.  It is tender at times.  She denies any associated redness or fevers with this.  She denies any numbness or tingling.  The area is not pulsatile.     Past Medical History:  Diagnosis Date   Asthma    Eczema     Patient Active Problem List   Diagnosis Date Noted   Peri-menopause 04/19/2021   Pre-diabetes 04/19/2021   Stress incontinence 06/08/2020   Chronic dermatitis of hands 04/30/2020   Essential hypertension 03/31/2020   Healthcare maintenance 03/31/2020    Past Surgical History:  Procedure Laterality Date   TUBAL LIGATION      OB History   No obstetric history on file.      Home Medications    Prior to Admission medications   Medication Sig Start Date End Date Taking? Authorizing Provider  amLODipine  (NORVASC ) 10 MG tablet TAKE 1 TABLET BY MOUTH EVERY DAY 04/21/22  Yes Nguyen, Quan, DO  amoxicillin  (AMOXIL ) 500 MG capsule Take 1 capsule (500  mg total) by mouth 3 (three) times daily. 10/20/23  Yes [place] Luppino A, PA-C  clobetasol  ointment (TEMOVATE ) 0.05 % Apply 1 Application topically 2 (two) times daily. Apply to hands and feet twice daily 10/20/23  Yes Brycen Bean A, PA-C  lisinopril  (ZESTRIL ) 20 MG tablet TAKE 1 TABLET BY MOUTH EVERY DAY 04/21/22  Yes Nguyen, Quan, DO    Family History Family History  Family history unknown: Yes    Social History Social History   Tobacco Use   Smoking status: Every Day   Smokeless tobacco: Current  Vaping Use   Vaping status: Never Used  Substance Use Topics   Alcohol use: Yes   Drug use: Yes    Types: Marijuana     Allergies   Patient has no known allergies.   Review of Systems Review of Systems  Constitutional:  Positive for chills and fever.  HENT:  Positive for ear pain and sore throat.   Eyes:  Negative for pain and visual disturbance.  Respiratory:  Negative for cough and shortness of breath.   Cardiovascular:  Negative for chest pain and palpitations.  Gastrointestinal:  Negative for abdominal pain and vomiting.  Genitourinary:  Negative for dysuria and hematuria.  Musculoskeletal:  Negative for arthralgias and back pain.       Tender area on the left antecubital fossa  Skin:  Positive for color change and rash.  Neurological:  Negative for seizures and syncope.  All other systems reviewed and are negative.    Physical Exam Triage Vital Signs ED Triage Vitals  Encounter Vitals Group     BP 10/20/23 1047 (!) 170/101     Systolic BP Percentile --      Diastolic BP Percentile --      Pulse Rate 10/20/23 1047 82     Resp 10/20/23 1047 16     Temp 10/20/23 1047 98.2 F (36.8 C)     Temp Source 10/20/23 1047 Oral     SpO2 10/20/23 1047 97 %     Weight --      Height --      Head Circumference --      Peak Flow --      Pain Score 10/20/23 1048 9     Pain Loc --      Pain Education --      Exclude from Growth Chart --    No data  found.  Updated Vital Signs BP (!) 170/101 (BP Location: Left Arm)   Pulse 82   Temp 98.2 F (36.8 C) (Oral)   Resp 16   SpO2 97%   Visual Acuity Right Eye Distance:   Left Eye Distance:   Bilateral Distance:    Right Eye Near:   Left Eye Near:    Bilateral Near:     Physical Exam Vitals and nursing note reviewed.  Constitutional:      General: She is not in acute distress.    Appearance: She is well-developed.  HENT:     Head: Normocephalic and atraumatic.     Right Ear: Tympanic membrane normal.     Left Ear: Tympanic membrane normal.     Nose: Nose normal.     Mouth/Throat:     Pharynx: Oropharyngeal exudate and posterior oropharyngeal erythema present.  Eyes:     Conjunctiva/sclera: Conjunctivae normal.  Cardiovascular:     Rate and Rhythm: Normal rate and regular rhythm.     Heart sounds: No murmur heard. Pulmonary:     Effort: Pulmonary effort is normal. No respiratory distress.     Breath sounds: Normal breath sounds.  Abdominal:     Palpations: Abdomen is soft.     Tenderness: There is no abdominal tenderness.  Musculoskeletal:        General: No swelling.     Cervical back: Neck supple.  Skin:    General: Skin is warm and dry.     Capillary Refill: Capillary refill takes less than 2 seconds.          Comments: Bilateral hands with thickened, dry flaking skin with fissures in the flexion areas.  This is also present in a lesser degree on the feet.  Both dorsal and palmar aspects as well as plantar aspects are affected.  Neurological:     Mental Status: She is alert.  Psychiatric:        Mood and Affect: Mood normal.      UC Treatments / Results  Labs (all labs ordered are listed, but only abnormal results are displayed) Labs Reviewed - No data to display  EKG   Radiology No results found.  Procedures Procedures (including critical care time)  Medications Ordered in UC Medications  methylPREDNISolone  acetate (DEPO-MEDROL ) injection 40  mg (has  no administration in time range)    Initial Impression / Assessment and Plan / UC Course  I have reviewed the triage vital signs and the nursing notes.  Pertinent labs & imaging results that were available during my care of the patient were reviewed by me and considered in my medical decision making (see chart for details).     Intrinsic eczema  Acute tonsillitis due to other specified organisms  Dermoid cyst of arm, left   Eczema flare of the hands and feet, will treat with injectable steroid and steroid ointment vs the creams. Also with evidence of tonsillitis and ill treat with antibiotics. Area on the left arm is likely a cyst. Treatment as follows for all conditions:  Amoxicillin  500 mg 3 times daily for 7 days, this is an antibiotic Medrol  injection given today, this is a steroid to help with inflammation Clobetasol  ointment twice daily to the hands and feet. Do not apply this to the neck or face.  CeraVe healing ointment 3 times daily to affected skin, can use this on top of the clobetasol  ointment.  The area on the left arm is likely a cyst or scar tissue. This is benign. If the area on the left arm continues to cause issues after another month, then recommend following up with general surgery to consider excision.  Blood pressure was addressed during visit and recommended you monitor this at home. Follow up with PCP for ongoing management. Discussed alarm symptoms that would warrant emergent evaluation to which patient expressed understanding.  Return to urgent care or PCP if symptoms worsen or fail to resolve.    Final Clinical Impressions(s) / UC Diagnoses   Final diagnoses:  Intrinsic eczema  Acute tonsillitis due to other specified organisms  Dermoid cyst of arm, left     Discharge Instructions      Eczema flare of the hands and feet, will treat with injectable steroid and steroid ointment vs the creams. Also with evidence of tonsillitis and ill treat with  antibiotics. Area on the left arm is likely a cyst. Treatment as follows for all conditions:  Amoxicillin  500 mg 3 times daily for 7 days, this is an antibiotic Medrol  injection given today, this is a steroid to help with inflammation Clobetasol  ointment twice daily to the hands and feet. Do not apply this to the neck or face.  CeraVe healing ointment 3 times daily to affected skin, can use this on top of the clobetasol  ointment.  The area on the left arm is likely a cyst or scar tissue. This is benign. If the area on the left arm continues to cause issues after another month, then recommend following up with general surgery to consider excision.  Blood pressure was addressed during visit and recommended you monitor this at home. Follow up with PCP for ongoing management. Discussed alarm symptoms that would warrant emergent evaluation to which patient expressed understanding.  Return to urgent care or PCP if symptoms worsen or fail to resolve.     ED Prescriptions     Medication Sig Dispense Auth. Provider   clobetasol  ointment (TEMOVATE ) 0.05 % Apply 1 Application topically 2 (two) times daily. Apply to hands and feet twice daily 60 g Ercell Razon A, PA-C   amoxicillin  (AMOXIL ) 500 MG capsule Take 1 capsule (500 mg total) by mouth 3 (three) times daily. 21 capsule Teresa Almarie LABOR, [place]      PDMP not reviewed this encounter.   Teresa Almarie LABOR, PA-C 10/20/23 1112

## 2023-10-20 NOTE — Discharge Instructions (Addendum)
 Eczema flare of the hands and feet, will treat with injectable steroid and steroid ointment vs the creams. Also with evidence of tonsillitis and ill treat with antibiotics. Area on the left arm is likely a cyst. Treatment as follows for all conditions:  Amoxicillin  500 mg 3 times daily for 7 days, this is an antibiotic Medrol  injection given today, this is a steroid to help with inflammation Clobetasol  ointment twice daily to the hands and feet. Do not apply this to the neck or face.  CeraVe healing ointment 3 times daily to affected skin, can use this on top of the clobetasol  ointment.  The area on the left arm is likely a cyst or scar tissue. This is benign. If the area on the left arm continues to cause issues after another month, then recommend following up with general surgery to consider excision.  Blood pressure was addressed during visit and recommended you monitor this at home. Follow up with PCP for ongoing management. Discussed alarm symptoms that would warrant emergent evaluation to which patient expressed understanding.  Return to urgent care or PCP if symptoms worsen or fail to resolve.

## 2023-11-24 ENCOUNTER — Observation Stay (HOSPITAL_COMMUNITY): Payer: Self-pay

## 2023-11-24 ENCOUNTER — Encounter (HOSPITAL_COMMUNITY): Payer: Self-pay

## 2023-11-24 ENCOUNTER — Other Ambulatory Visit: Payer: Self-pay

## 2023-11-24 ENCOUNTER — Inpatient Hospital Stay (HOSPITAL_COMMUNITY)
Admission: EM | Admit: 2023-11-24 | Discharge: 2023-11-27 | DRG: 193 | Disposition: A | Discharge status: Home / Self Care | Payer: Self-pay | Attending: Family Medicine | Admitting: Family Medicine

## 2023-11-24 ENCOUNTER — Ambulatory Visit (HOSPITAL_COMMUNITY)
Admission: EM | Admit: 2023-11-24 | Discharge: 2023-11-24 | Disposition: A | Discharge status: Other Acute Inpatient Hospital | Payer: Self-pay | Attending: Emergency Medicine | Admitting: Emergency Medicine

## 2023-11-24 ENCOUNTER — Ambulatory Visit (INDEPENDENT_AMBULATORY_CARE_PROVIDER_SITE_OTHER): Payer: Self-pay

## 2023-11-24 DIAGNOSIS — I10 Essential (primary) hypertension: Secondary | ICD-10-CM | POA: Diagnosis present

## 2023-11-24 DIAGNOSIS — Z79899 Other long term (current) drug therapy: Secondary | ICD-10-CM

## 2023-11-24 DIAGNOSIS — Z1152 Encounter for screening for COVID-19: Secondary | ICD-10-CM

## 2023-11-24 DIAGNOSIS — Z9851 Tubal ligation status: Secondary | ICD-10-CM

## 2023-11-24 DIAGNOSIS — J45901 Unspecified asthma with (acute) exacerbation: Secondary | ICD-10-CM | POA: Diagnosis present

## 2023-11-24 DIAGNOSIS — J9601 Acute respiratory failure with hypoxia: Secondary | ICD-10-CM

## 2023-11-24 DIAGNOSIS — R509 Fever, unspecified: Secondary | ICD-10-CM

## 2023-11-24 DIAGNOSIS — J45909 Unspecified asthma, uncomplicated: Secondary | ICD-10-CM | POA: Insufficient documentation

## 2023-11-24 DIAGNOSIS — F17200 Nicotine dependence, unspecified, uncomplicated: Secondary | ICD-10-CM | POA: Diagnosis present

## 2023-11-24 DIAGNOSIS — J4521 Mild intermittent asthma with (acute) exacerbation: Secondary | ICD-10-CM

## 2023-11-24 DIAGNOSIS — J101 Influenza due to other identified influenza virus with other respiratory manifestations: Principal | ICD-10-CM | POA: Insufficient documentation

## 2023-11-24 LAB — CBC WITH DIFFERENTIAL/PLATELET
Abs Immature Granulocytes: 0.05 10*3/uL (ref 0.00–0.07)
Basophils Absolute: 0 10*3/uL (ref 0.0–0.1)
Basophils Relative: 0 %
Eosinophils Absolute: 0 10*3/uL (ref 0.0–0.5)
Eosinophils Relative: 0 %
HCT: 47.7 % — ABNORMAL HIGH (ref 36.0–46.0)
Hemoglobin: 16.3 g/dL — ABNORMAL HIGH (ref 12.0–15.0)
Immature Granulocytes: 1 %
Lymphocytes Relative: 4 %
Lymphs Abs: 0.4 10*3/uL — ABNORMAL LOW (ref 0.7–4.0)
MCH: 29 pg (ref 26.0–34.0)
MCHC: 34.2 g/dL (ref 30.0–36.0)
MCV: 84.7 fL (ref 80.0–100.0)
Monocytes Absolute: 0.6 10*3/uL (ref 0.1–1.0)
Monocytes Relative: 6 %
Neutro Abs: 8.7 10*3/uL — ABNORMAL HIGH (ref 1.7–7.7)
Neutrophils Relative %: 89 %
Platelets: 249 10*3/uL (ref 150–400)
RBC: 5.63 MIL/uL — ABNORMAL HIGH (ref 3.87–5.11)
RDW: 12.5 % (ref 11.5–15.5)
WBC: 9.7 10*3/uL (ref 4.0–10.5)
nRBC: 0 % (ref 0.0–0.2)

## 2023-11-24 LAB — BLOOD GAS, VENOUS
Acid-Base Excess: 4.6 mmol/L — ABNORMAL HIGH (ref 0.0–2.0)
Bicarbonate: 28.3 mmol/L — ABNORMAL HIGH (ref 20.0–28.0)
Drawn by: 8431
O2 Saturation: 96.2 %
Patient temperature: 37
pCO2, Ven: 38 mm[Hg] — ABNORMAL LOW (ref 44–60)
pH, Ven: 7.48 — ABNORMAL HIGH (ref 7.25–7.43)
pO2, Ven: 70 mm[Hg] — ABNORMAL HIGH (ref 32–45)

## 2023-11-24 LAB — BRAIN NATRIURETIC PEPTIDE: B Natriuretic Peptide: 19.8 pg/mL (ref 0.0–100.0)

## 2023-11-24 LAB — POC COVID19/FLU A&B COMBO
Covid Antigen, POC: NEGATIVE
Influenza A Antigen, POC: NEGATIVE
Influenza B Antigen, POC: NEGATIVE

## 2023-11-24 LAB — BASIC METABOLIC PANEL
Anion gap: 14 (ref 5–15)
BUN: 14 mg/dL (ref 6–20)
CO2: 21 mmol/L — ABNORMAL LOW (ref 22–32)
Calcium: 9.5 mg/dL (ref 8.9–10.3)
Chloride: 99 mmol/L (ref 98–111)
Creatinine, Ser: 0.63 mg/dL (ref 0.44–1.00)
GFR, Estimated: >60 mL/min (ref >60)
Glucose, Bld: 105 mg/dL — ABNORMAL HIGH (ref 70–99)
Potassium: 3.5 mmol/L (ref 3.5–5.1)
Sodium: 134 mmol/L — ABNORMAL LOW (ref 135–145)

## 2023-11-24 LAB — RESP PANEL BY RT-PCR (RSV, FLU A&B, COVID)  RVPGX2
Influenza A by PCR: POSITIVE — AB
Influenza B by PCR: NEGATIVE
Resp Syncytial Virus by PCR: NEGATIVE
SARS Coronavirus 2 by RT PCR: NEGATIVE

## 2023-11-24 LAB — D-DIMER, QUANTITATIVE: D-Dimer, Quant: <0.27 ug{FEU}/mL (ref 0.00–0.50)

## 2023-11-24 LAB — MRSA NEXT GEN BY PCR, NASAL: MRSA by PCR Next Gen: NOT DETECTED

## 2023-11-24 LAB — TROPONIN I (HIGH SENSITIVITY)
Troponin I (High Sensitivity): 4 ng/L (ref <18)
Troponin I (High Sensitivity): 6 ng/L (ref <18)

## 2023-11-24 MED ORDER — ACETAMINOPHEN 325 MG PO TABS
650.0000 mg | ORAL_TABLET | ORAL | Status: AC
  Administered 2023-11-24: 650 mg via ORAL
  Filled 2023-11-24: qty 2

## 2023-11-24 MED ORDER — AMLODIPINE BESYLATE 10 MG PO TABS
10.0000 mg | ORAL_TABLET | Freq: Every day | ORAL | Status: DC
  Administered 2023-11-25 – 2023-11-27 (×3): 10 mg via ORAL
  Filled 2023-11-24 (×3): qty 1

## 2023-11-24 MED ORDER — IPRATROPIUM BROMIDE 0.02 % IN SOLN
0.5000 mg | Freq: Four times a day (QID) | RESPIRATORY_TRACT | Status: DC
  Administered 2023-11-24 – 2023-11-26 (×4): 0.5 mg via RESPIRATORY_TRACT
  Filled 2023-11-24 (×4): qty 2.5

## 2023-11-24 MED ORDER — ENOXAPARIN SODIUM 40 MG/0.4ML IJ SOSY
40.0000 mg | PREFILLED_SYRINGE | INTRAMUSCULAR | Status: DC
  Administered 2023-11-25 – 2023-11-27 (×3): 40 mg via SUBCUTANEOUS
  Filled 2023-11-24 (×3): qty 0.4

## 2023-11-24 MED ORDER — ACETAMINOPHEN 325 MG PO TABS
650.0000 mg | ORAL_TABLET | Freq: Four times a day (QID) | ORAL | Status: DC | PRN
  Administered 2023-11-25 – 2023-11-27 (×5): 650 mg via ORAL
  Filled 2023-11-24 (×5): qty 2

## 2023-11-24 MED ORDER — FENTANYL CITRATE PF 50 MCG/ML IJ SOSY
50.0000 ug | PREFILLED_SYRINGE | Freq: Once | INTRAMUSCULAR | Status: AC
  Administered 2023-11-24: 50 ug via INTRAVENOUS
  Filled 2023-11-24: qty 1

## 2023-11-24 MED ORDER — ACETAMINOPHEN 325 MG PO TABS
975.0000 mg | ORAL_TABLET | Freq: Once | ORAL | Status: AC
  Administered 2023-11-24: 975 mg via ORAL

## 2023-11-24 MED ORDER — POLYETHYLENE GLYCOL 3350 17 G PO PACK: 17.0000 g | PACK | Freq: Every day | ORAL | Status: DC | PRN

## 2023-11-24 MED ORDER — PREDNISONE 20 MG PO TABS
50.0000 mg | ORAL_TABLET | Freq: Every day | ORAL | Status: DC
  Administered 2023-11-25 – 2023-11-27 (×3): 50 mg via ORAL
  Filled 2023-11-24 (×3): qty 1

## 2023-11-24 MED ORDER — IPRATROPIUM-ALBUTEROL 0.5-2.5 (3) MG/3ML IN SOLN
3.0000 mL | Freq: Once | RESPIRATORY_TRACT | Status: AC
  Administered 2023-11-24: 3 mL via RESPIRATORY_TRACT

## 2023-11-24 MED ORDER — IPRATROPIUM-ALBUTEROL 0.5-2.5 (3) MG/3ML IN SOLN
RESPIRATORY_TRACT | Status: AC
  Filled 2023-11-24: qty 3

## 2023-11-24 MED ORDER — ACETAMINOPHEN 325 MG PO TABS: 650.0000 mg | ORAL_TABLET | Freq: Four times a day (QID) | ORAL | Status: DC | PRN

## 2023-11-24 MED ORDER — ACETAMINOPHEN 325 MG PO TABS
ORAL_TABLET | ORAL | Status: AC
  Filled 2023-11-24: qty 3

## 2023-11-24 MED ORDER — SODIUM CHLORIDE 0.9% FLUSH
3.0000 mL | Freq: Two times a day (BID) | INTRAVENOUS | Status: DC
  Administered 2023-11-25 – 2023-11-27 (×5): 3 mL via INTRAVENOUS

## 2023-11-24 MED ORDER — IBUPROFEN 200 MG PO TABS
ORAL_TABLET | ORAL | Status: AC
  Filled 2023-11-24: qty 3

## 2023-11-24 MED ORDER — ACETAMINOPHEN 650 MG RE SUPP: 650.0000 mg | Freq: Four times a day (QID) | RECTAL | Status: DC | PRN

## 2023-11-24 MED ORDER — DEXAMETHASONE SODIUM PHOSPHATE 10 MG/ML IJ SOLN: 10.0000 mg | Freq: Once | INTRAMUSCULAR | Status: DC

## 2023-11-24 MED ORDER — LISINOPRIL 20 MG PO TABS
20.0000 mg | ORAL_TABLET | Freq: Every day | ORAL | Status: DC
  Administered 2023-11-25 – 2023-11-27 (×3): 20 mg via ORAL
  Filled 2023-11-24 (×3): qty 1

## 2023-11-24 MED ORDER — ALBUTEROL SULFATE (2.5 MG/3ML) 0.083% IN NEBU: 2.5000 mg | INHALATION_SOLUTION | RESPIRATORY_TRACT | Status: DC | PRN

## 2023-11-24 MED ORDER — OSELTAMIVIR PHOSPHATE 75 MG PO CAPS
75.0000 mg | ORAL_CAPSULE | Freq: Once | ORAL | Status: AC
  Administered 2023-11-24: 75 mg via ORAL
  Filled 2023-11-24: qty 1

## 2023-11-24 MED ORDER — LORAZEPAM 2 MG/ML IJ SOLN
0.5000 mg | Freq: Once | INTRAMUSCULAR | Status: AC
  Administered 2023-11-24: 0.5 mg via INTRAVENOUS
  Filled 2023-11-24: qty 1

## 2023-11-24 MED ORDER — IBUPROFEN 400 MG PO TABS
600.0000 mg | ORAL_TABLET | Freq: Once | ORAL | Status: AC
  Administered 2023-11-24: 600 mg via ORAL

## 2023-11-24 MED ORDER — OSELTAMIVIR PHOSPHATE 75 MG PO CAPS
75.0000 mg | ORAL_CAPSULE | Freq: Two times a day (BID) | ORAL | Status: DC
  Administered 2023-11-25 – 2023-11-27 (×5): 75 mg via ORAL
  Filled 2023-11-24 (×5): qty 1

## 2023-11-24 NOTE — ED Triage Notes (Signed)
 Pt to ED via EMS from Urgent Care c/o SOB, fever & cough that started yesterday. 102. 86% RA. Given 975mg  APAP & duoneb. 20g R forearm. On 4L [place] sats @ 93-94%.

## 2023-11-24 NOTE — ED Notes (Signed)
 CARELINK CALLED BY RN AND IN ROUTE. CARELINK CALLING BACK IN AND GIVEN REPORT ON THE PATIENT.   CALLED CHARGE RN AT [place], LEFT A DETAILED VOICEMAIL.

## 2023-11-24 NOTE — ED Notes (Signed)
 CALLED CHARGE RN AT [place], LEFT A VOICEMAIL.

## 2023-11-24 NOTE — ED Notes (Signed)
 Patient ready to be transported upstairs, will transport patient as soon as ED RT is available

## 2023-11-24 NOTE — Progress Notes (Signed)
 Pt transported on BiPAP with RN to 2C05. No complications noted.

## 2023-11-24 NOTE — Progress Notes (Signed)
   11/24/23 2200  Assess: MEWS Score  Temp 97.9 F (36.6 C)  BP 122/83  MAP (mmHg) 96  Pulse Rate (!) 101  ECG Heart Rate (!) 101  Resp (!) 32  SpO2 95 %  O2 Device Bi-PAP  Assess: MEWS Score  MEWS Temp 0  MEWS Systolic 0  MEWS Pulse 1  MEWS RR 2  MEWS LOC 0  MEWS Score 3  MEWS Score Color Yellow  Assess: if the MEWS score is Yellow or Red  Were vital signs accurate and taken at a resting state? Yes  Does the patient meet 2 or more of the SIRS criteria? Yes  Does the patient have a confirmed or suspected source of infection? Yes  MEWS guidelines implemented  Yes, yellow  Treat  MEWS Interventions Considered administering scheduled or prn medications/treatments as ordered  Take Vital Signs  Increase Vital Sign Frequency  Yellow: Q2hr x1, continue Q4hrs until patient remains green for 12hrs  Escalate  MEWS: Escalate Yellow: Discuss with charge nurse and consider notifying provider and/or RRT  Notify: Charge Nurse/RN  Name of Charge Nurse/RN Notified Shiwangi RN  Assess: SIRS CRITERIA  SIRS Temperature  0  SIRS Respirations  1  SIRS Pulse 1  SIRS WBC 0  SIRS Score Sum  2

## 2023-11-24 NOTE — ED Triage Notes (Signed)
 Chief Complaint: cough, unable to sleep, SOB, body aches. Patient has history of asthma and thinks she is having an asthma attack. Does not have any inhalers at home.  Sick exposure: Yes- her husband who is being seen today. Went last week to see their son at university.   Onset: 2 days   Prescriptions or OTC medications tried: Yes- tylenol, BC powder    with no relief  New foods, medications, or products: No  Recent Travel: No

## 2023-11-24 NOTE — ED Provider Notes (Signed)
 [place] EMERGENCY DEPARTMENT AT [facility] Provider Note   CSN: [id number] Arrival date & time: 11/24/23  1410     History  No chief complaint on file.   Kara Ramsey is a 53 y.o. female.  She has a history of asthma but has not been troubled with it in many years.  She just returned from a trip to visit her son in [place].  Husband got sick with URI symptoms 4 days ago which she started 2 days ago with same.  Dry nonproductive cough, fevers and chills, body aches, sore throat.  Went to urgent care today where she tested negative for COVID and flu and had a negative chest x-ray.  She was however hypoxic and so they did a breathing treatment and Decadron and recommended she come here for further evaluation.  She says she has some left-sided chest pain with cough.  She is a smoker.  No vomiting diarrhea or urinary symptoms.  She was also given Tylenol prior to transport and has a fever here of 101.7.  The history is provided by the patient.  Cough Cough characteristics:  Non-productive Sputum characteristics:  Nondescript Severity:  Moderate Onset quality:  Gradual Duration:  2 days Timing:  Intermittent Progression:  Unchanged Chronicity:  New Smoker: yes   Context: upper respiratory infection   Relieved by:  Nothing Worsened by:  Activity and deep breathing Associated symptoms: chest pain, chills, fever, myalgias, rhinorrhea, shortness of breath and sore throat   Risk factors: recent travel        Home Medications Prior to Admission medications   Medication Sig Start Date End Date Taking? Authorizing Provider  amLODipine (NORVASC) 10 MG tablet TAKE 1 TABLET BY MOUTH EVERY DAY 04/21/22   Doran Stabler, DO  clobetasol ointment (TEMOVATE) 0.05 % Apply 1 Application topically 2 (two) times daily. Apply to hands and feet twice daily 10/20/23   Doristine Mango A, PA-C  lisinopril (ZESTRIL) 20 MG tablet TAKE 1 TABLET BY MOUTH EVERY DAY 04/21/22   Doran Stabler, DO       Allergies    Patient has no known allergies.    Review of Systems   Review of Systems  Constitutional:  Positive for chills and fever.  HENT:  Positive for rhinorrhea and sore throat.   Eyes:  Negative for visual disturbance.  Respiratory:  Positive for cough and shortness of breath.   Cardiovascular:  Positive for chest pain.  Gastrointestinal:  Negative for abdominal pain, diarrhea, nausea and vomiting.  Genitourinary:  Negative for dysuria.  Musculoskeletal:  Positive for myalgias.    Physical Exam Updated Vital Signs BP (!) 143/85   Pulse (!) 119   Temp (!) 101.7 F (38.7 C) (Oral)   Resp (!) 27   LMP 10/26/2023 (Approximate)   SpO2 100%  Physical Exam Vitals and nursing note reviewed.  Constitutional:      General: She is not in acute distress.    Appearance: Normal appearance. She is well-developed.  HENT:     Head: Normocephalic and atraumatic.  Eyes:     Conjunctiva/sclera: Conjunctivae normal.  Cardiovascular:     Rate and Rhythm: Normal rate and regular rhythm.     Heart sounds: No murmur heard. Pulmonary:     Effort: Tachypnea and accessory muscle usage present. No respiratory distress.     Breath sounds: Wheezing (few scattered) present.  Abdominal:     Palpations: Abdomen is soft.     Tenderness: There is no abdominal tenderness. There  is no guarding or rebound.  Musculoskeletal:     Cervical back: Neck supple.     Right lower leg: No edema.     Left lower leg: No edema.  Skin:    General: Skin is warm and dry.     Capillary Refill: Capillary refill takes less than 2 seconds.  Neurological:     General: No focal deficit present.     Mental Status: She is alert.     ED Results / Procedures / Treatments   Labs (all labs ordered are listed, but only abnormal results are displayed) Labs Reviewed  RESP PANEL BY RT-PCR (RSV, FLU A&B, COVID)  RVPGX2 - Abnormal; Notable for the following components:      Result Value   Influenza A by PCR POSITIVE  (*)    All other components within normal limits  BASIC METABOLIC PANEL - Abnormal; Notable for the following components:   Sodium 134 (*)    CO2 21 (*)    Glucose, Bld 105 (*)    All other components within normal limits  CBC WITH DIFFERENTIAL/PLATELET - Abnormal; Notable for the following components:   RBC 5.63 (*)    Hemoglobin 16.3 (*)    HCT 47.7 (*)    Neutro Abs 8.7 (*)    Lymphs Abs 0.4 (*)    All other components within normal limits  BLOOD GAS, VENOUS - Abnormal; Notable for the following components:   pH, Ven 7.48 (*)    pCO2, Ven 38 (*)    pO2, Ven 70 (*)    Bicarbonate 28.3 (*)    Acid-Base Excess 4.6 (*)    All other components within normal limits  CULTURE, BLOOD (ROUTINE X 2)  CULTURE, BLOOD (ROUTINE X 2)  BRAIN NATRIURETIC PEPTIDE  D-DIMER, QUANTITATIVE  CBC  COMPREHENSIVE METABOLIC PANEL  HIV ANTIBODY (ROUTINE TESTING W REFLEX)  TROPONIN I (HIGH SENSITIVITY)  TROPONIN I (HIGH SENSITIVITY)    EKG EKG Interpretation Date/Time:  Saturday November 24 2023 14:56:07 EST Ventricular Rate:  115 PR Interval:  123 QRS Duration:  83 QT Interval:  338 QTC Calculation: 468 R Axis:   56  Text Interpretation: Incomplete analysis due to missing data in precordial lead(s) Sinus tachycardia Probable left atrial enlargement Missing lead(s): V5 No significant change since last tracing Confirmed by Meridee Score [phone]) on 11/24/2023 2:59:18 PM  Radiology DG CHEST PORT 1 VIEW Result Date: 11/24/2023 CLINICAL DATA:  [id number] Acute respiratory failure with hypoxia [facility]) [id number] EXAM: PORTABLE CHEST 1 VIEW COMPARISON:  November 24, 2023, Mar 04, 2012 FINDINGS: The cardiomediastinal silhouette is unchanged in contour. No pleural effusion. No pneumothorax. Mildly prominent bronchitic markings with LEFT basilar reticular nodularity. IMPRESSION: Mildly prominent bronchitic markings favored to reflect underlying small airways disease. Electronically Signed   By: Meda Klinefelter  M.D.   On: 11/24/2023 18:24   DG Chest 2 View Result Date: 11/24/2023 CLINICAL DATA:  Cough.  Shortness of breath. EXAM: CHEST - 2 VIEW COMPARISON:  03/04/2012. FINDINGS: Bilateral lung fields are clear. Bilateral costophrenic angles are clear. Normal cardio-mediastinal silhouette. No acute osseous abnormalities. The soft tissues are within normal limits. IMPRESSION: No active cardiopulmonary disease. Electronically Signed   By: Jules Schick M.D.   On: 11/24/2023 13:44    Procedures .Critical Care  Performed by: Terrilee Files, MD Authorized by: Terrilee Files, MD   Critical care provider statement:    Critical care time (minutes):  45   Critical care time was exclusive of:  Separately  billable procedures and treating other patients   Critical care was necessary to treat or prevent imminent or life-threatening deterioration of the following conditions:  Respiratory failure   Critical care was time spent personally by me on the following activities:  Development of treatment plan with patient or surrogate, discussions with consultants, evaluation of patient's response to treatment, examination of patient, obtaining history from patient or surrogate, ordering and performing treatments and interventions, ordering and review of laboratory studies, ordering and review of radiographic studies, pulse oximetry, re-evaluation of patient's condition and review of old charts   I assumed direction of critical care for this patient from another provider in my specialty: no       Medications Ordered in ED Medications  amLODipine (NORVASC) tablet 10 mg (has no administration in time range)  lisinopril (ZESTRIL) tablet 20 mg (has no administration in time range)  predniSONE (DELTASONE) tablet 50 mg (has no administration in time range)  ipratropium (ATROVENT) nebulizer solution 0.5 mg (0.5 mg Nebulization Given 11/24/23 1641)  albuterol (PROVENTIL) (2.5 MG/3ML) 0.083% nebulizer solution 2.5 mg (has  no administration in time range)  enoxaparin (LOVENOX) injection 40 mg (has no administration in time range)  oseltamivir (TAMIFLU) capsule 75 mg (has no administration in time range)  sodium chloride flush (NS) 0.9 % injection 3 mL (has no administration in time range)  polyethylene glycol (MIRALAX / GLYCOLAX) packet 17 g (has no administration in time range)  acetaminophen (TYLENOL) tablet 650 mg (has no administration in time range)    Or  acetaminophen (TYLENOL) suppository 650 mg (has no administration in time range)  ibuprofen (ADVIL) 200 MG tablet (  Canceled Entry 11/24/23 1805)  fentaNYL (SUBLIMAZE) injection 50 mcg (50 mcg Intravenous Given 11/24/23 1633)  oseltamivir (TAMIFLU) capsule 75 mg (75 mg Oral Given 11/24/23 1633)  acetaminophen (TYLENOL) tablet 650 mg (650 mg Oral Given 11/24/23 1641)  ibuprofen (ADVIL) tablet 600 mg (600 mg Oral Given 11/24/23 1750)  LORazepam (ATIVAN) injection 0.5 mg (0.5 mg Intravenous Given 11/24/23 1844)    ED Course/ Medical Decision Making/ A&P Clinical Course as of 11/24/23 2036  Sat Nov 24, 2023  1604 Patient continues to require oxygen and remains tachycardic.  Had placed a consult into hospitalist for admission. [MB]  1627 Patient tested positive for influenza.  Discussed with [facility] hospitalist Dr. Alinda Money who will evaluate her for admission. [MB]    Clinical Course User Index [MB] Terrilee Files, MD                                 Medical Decision Making Amount and/or Complexity of Data Reviewed Labs: ordered.  Risk Prescription drug management. Decision regarding hospitalization.   This patient complains of fever body aches cough wheezing shortness of breath; this involves an extensive number of treatment Options and is a complaint that carries with it a high risk of complications and morbidity. The differential includes COVID food, flu, pneumonia, pneumothorax, PE  I ordered, reviewed and interpreted labs, which included CBC  unremarkable chemistries with mildly low bicarb flu positive troponins BNP normal D-dimer negative I ordered medication breathing treatments pain medicine and flu medicine and reviewed PMP when indicated. I ordered imaging studies which included chest x-ray and I independently    visualized and interpreted imaging which showed no cute infiltrate Additional history obtained from patient's husband Previous records obtained and reviewed in epic including urgent care visit today I consulted [facility]  hospitalist Dr. Alinda Money and discussed lab and imaging findings and discussed disposition.  Cardiac monitoring reviewed, sinus tachycardia Social determinants considered, tobacco use Critical Interventions: Patient of oxygen and breathing treatments pain medicine, multiple reassessments  After the interventions stated above, I reevaluated the patient and found patient still to have increased work of breathing and hypoxia Admission and further testing considered, patient will benefit from mission to the hospital for further management.  She is in agreement with plan for admission.         Final Clinical Impression(s) / ED Diagnoses Final diagnoses:  Acute respiratory failure with hypoxia (HCC)  Influenza A    Rx / DC Orders ED Discharge Orders     None         Terrilee Files, MD 11/24/23 2039

## 2023-11-24 NOTE — ED Provider Notes (Signed)
 [facility]    CSN: [id number] Arrival date & time: 11/24/23  1137      History   Chief Complaint Chief Complaint  Patient presents with   Cough   Shortness of Breath    HPI Kara Ramsey is a 53 y.o. female.   Patient presents to clinic with complaints of a cough that started yesterday.  She began coughing, became short of breath and wheezing.  Feels like she is unable to move air efficiently.  She does have a history of asthma but has not had an exacerbation in a long time.  She has not have any inhalers at home.  Recently she went to [place] to visit her son who is in school.  Her husband was sick and coughing on Monday.  They live together and she was exposed to what ever he had. Has had Tylenol and BC powder without any relief.  Concerned over an itchy rash to her bilateral hands and would like a refill of her clobetasol.   The history is provided by the patient and medical records.  Cough   Past Medical History:  Diagnosis Date   Asthma    Eczema     Patient Active Problem List   Diagnosis Date Noted   Peri-menopause 04/19/2021   Pre-diabetes 04/19/2021   Stress incontinence 06/08/2020   Chronic dermatitis of hands 04/30/2020   Essential hypertension 03/31/2020   Healthcare maintenance 03/31/2020    Past Surgical History:  Procedure Laterality Date   INJECTION, BULKING AGENT, URETHRA     TUBAL LIGATION      OB History   No obstetric history on file.      Home Medications    Prior to Admission medications   Medication Sig Start Date End Date Taking? Authorizing Provider  amLODipine (NORVASC) 10 MG tablet TAKE 1 TABLET BY MOUTH EVERY DAY 04/21/22  Yes Doran Stabler, DO  clobetasol ointment (TEMOVATE) 0.05 % Apply 1 Application topically 2 (two) times daily. Apply to hands and feet twice daily 10/20/23  Yes White, Elizabeth A, PA-C  lisinopril (ZESTRIL) 20 MG tablet TAKE 1 TABLET BY MOUTH EVERY DAY 04/21/22  Yes Doran Stabler, DO     Family History Family History  Family history unknown: Yes    Social History Social History   Tobacco Use   Smoking status: Every Day   Smokeless tobacco: Current  Vaping Use   Vaping status: Never Used  Substance Use Topics   Alcohol use: Yes   Drug use: Yes    Types: Marijuana     Allergies   Patient has no known allergies.   Review of Systems Review of Systems  Per HPI   Physical Exam Triage Vital Signs ED Triage Vitals  Encounter Vitals Group     BP 11/24/23 1239 (!) 150/83     Systolic BP Percentile --      Diastolic BP Percentile --      Pulse Rate 11/24/23 1239 (!) 134     Resp 11/24/23 1239 (!) 32     Temp 11/24/23 1239 (!) 102 F (38.9 C)     Temp Source 11/24/23 1239 Oral     SpO2 11/24/23 1239 (!) 86 %     Weight 11/24/23 1239 188 lb 0.8 oz (85.3 kg)     Height 11/24/23 1239 5\' 4"  (1.626 m)     Head Circumference --      Peak Flow --      Pain Score 11/24/23 1237 9  Pain Loc --      Pain Education --      Exclude from Growth Chart --    No data found.  Updated Vital Signs BP (!) 150/83 (BP Location: Left Arm)   Pulse (!) 136   Temp (!) 100.7 F (38.2 C) (Oral)   Resp (!) 28   Ht 5\' 4"  (1.626 m)   Wt 188 lb 0.8 oz (85.3 kg)   LMP 10/26/2023 (Approximate)   SpO2 93%   BMI 32.28 kg/m   Visual Acuity Right Eye Distance:   Left Eye Distance:   Bilateral Distance:    Right Eye Near:   Left Eye Near:    Bilateral Near:     Physical Exam Vitals reviewed.  Constitutional:      Appearance: Normal appearance.  HENT:     Head: Normocephalic and atraumatic.     Right Ear: External ear normal.     Left Ear: External ear normal.     Nose: Congestion and rhinorrhea present.     Mouth/Throat:     Mouth: Mucous membranes are moist.  Eyes:     Conjunctiva/sclera: Conjunctivae normal.  Cardiovascular:     Rate and Rhythm: Regular rhythm. Tachycardia present.     Heart sounds: Normal heart sounds. No murmur heard. Pulmonary:      Effort: Respiratory distress present.     Breath sounds: Decreased air movement present.  Musculoskeletal:        General: Normal range of motion.  Skin:    General: Skin is warm and dry.  Neurological:     General: No focal deficit present.     Mental Status: She is alert.  Psychiatric:        Behavior: Behavior is cooperative.      UC Treatments / Results  Labs (all labs ordered are listed, but only abnormal results are displayed) Labs Reviewed  POC COVID19/FLU A&B COMBO    EKG   Radiology DG Chest 2 View Result Date: 11/24/2023 CLINICAL DATA:  Cough.  Shortness of breath. EXAM: CHEST - 2 VIEW COMPARISON:  03/04/2012. FINDINGS: Bilateral lung fields are clear. Bilateral costophrenic angles are clear. Normal cardio-mediastinal silhouette. No acute osseous abnormalities. The soft tissues are within normal limits. IMPRESSION: No active cardiopulmonary disease. Electronically Signed   By: Jules Schick M.D.   On: 11/24/2023 13:44    Procedures Procedures (including critical care time)  Medications Ordered in UC Medications  acetaminophen (TYLENOL) tablet 975 mg (975 mg Oral Given 11/24/23 1252)  ipratropium-albuterol (DUONEB) 0.5-2.5 (3) MG/3ML nebulizer solution 3 mL (3 mLs Nebulization Given 11/24/23 1252)    Initial Impression / Assessment and Plan / UC Course  I have reviewed the triage vital signs and the nursing notes.  Pertinent labs & imaging results that were available during my care of the patient were reviewed by me and considered in my medical decision making (see chart for details).  Vitals and triage reviewed, patient is hypoxic on presentation, tachypneic, tachycardic, febrile and hypertensive.   She is unable to speak in full sentences and has diminished airway movement on auscultation.  Oxygenation up to 93% on 4 L via nasal cannula.  Discussed due to severity of presentation that while we can try interventions at the urgent care, ultimately we may  not be able to fix her hypoxia and she would have to have further evaluation in the emergency department.  Patient would like to avoid this, she does not feel like waiting or being in the  hospital so we will try interventions at the urgent care.  1:30 pm - POC COVID and flu testing is negative.  Fever has improved, tachycardia and hypoxia remained.  Placed on facemask, continues to have low oxygen levels and tachypnea.  Discussed at this time with no improvement with the DuoNeb and continuing to require oxygen, that she would benefit from further evaluation at the nearest emergency department.  Will call [facility] to transport via ambulance due to oxygen requirement.  Staff placing IV, EKG ordered. CXR interpretation no acute cardiopulmonary disease.  1:50 pm - EKG shows sinus tachycardia at a rate of 118 bpm.  [facility] at bedside to transport to [facility] emergency department.     Final Clinical Impressions(s) / UC Diagnoses   Final diagnoses:  Intermittent asthma with acute exacerbation, unspecified asthma severity  Acute respiratory failure with hypoxia (HCC)  Fever, unspecified fever cause   Discharge Instructions   None    ED Prescriptions   None    PDMP not reviewed this encounter.   Barbi Kumagai, [place] N, [place] 11/24/23 1359

## 2023-11-24 NOTE — ED Notes (Signed)
 Patient is being discharged from the Urgent Care and sent to the Emergency Department via [facility] . Per [place] Garrison NP, patient is in need of higher level of care due to Hypoxia and dyspnea. Patient is aware and verbalizes understanding of plan of care.  Vitals:   11/24/23 1313 11/24/23 1326  BP:    Pulse:  (!) 136  Resp:  (!) 28  Temp:  (!) 100.7 F (38.2 C)  SpO2: 91% 93%

## 2023-11-24 NOTE — Progress Notes (Signed)
Pt refusing ABG . MD notified.

## 2023-11-24 NOTE — H&P (Addendum)
 History and Physical   Kara Ramsey WUX:[id number] DOB: 11-Oct-1970 DOA: 11/24/2023  PCP: No primary care provider on file.  Establishing with new PCP, with  per patient.  Patient coming from: Home, urgent care  Chief Complaint: URI symptoms, hypoxia  HPI: Kara Ramsey is a 53 y.o. female with medical history significant of hypertension, asthma presenting with hypoxia.  Patient presenting with 2 days of URI symptoms.  She went on a recent trip to [place] and afterwards her husband has had URI symptoms for 4 days.  As above she has had symptoms for 2 days.  She reports cough, fever, chills, body aches, sore throat.  Seen in urgent care today and had negative respiratory antigen screen there and negative chest x-ray, but was hypoxic and was sent to the ED for further evaluation.  Prior to transfer she received Decadron, nebulizer treatment, Tylenol.  Patient does report history of asthma but has not had any issues with this for years.  Denies chest pain, abdominal pain, constipation, diarrhea, nausea, vomiting.  ED Course: Vital signs in the ED notable for fever to 102, heart rate in the 110s to 130s, blood pressure 1 4150 systolic, respirate in the 20s to 30s, requiring 4 to 6 L to maintain saturations.  Lab workup included BMP with sodium 134, bicarb 21, glucose 1 5.  CBC with hemoglobin 16.3.  BNP normal.  Troponin normal with repeat pending.  Respiratory PCR positive for influenza A.  Blood cultures pending.  Chest x-ray as above showed no acute normality.  Patient received fentanyl and a dose of Tamiflu ordered in ED.  Review of Systems: As per HPI otherwise all other systems reviewed and are negative.  Past Medical History:  Diagnosis Date   Asthma    Eczema     Past Surgical History:  Procedure Laterality Date   INJECTION, BULKING AGENT, URETHRA     TUBAL LIGATION      Social History  reports that she has been smoking. She uses smokeless tobacco. She reports current  alcohol use. She reports current drug use. Drug: Marijuana.  No Known Allergies  Family History  Family history unknown: Yes  Reviewed on admission  Prior to Admission medications   Medication Sig Start Date End Date Taking? Authorizing Provider  amLODipine (NORVASC) 10 MG tablet TAKE 1 TABLET BY MOUTH EVERY DAY 04/21/22   Doran Stabler, DO  clobetasol ointment (TEMOVATE) 0.05 % Apply 1 Application topically 2 (two) times daily. Apply to hands and feet twice daily 10/20/23   Doristine Mango A, PA-C  lisinopril (ZESTRIL) 20 MG tablet TAKE 1 TABLET BY MOUTH EVERY DAY 04/21/22   Doran Stabler, DO    Physical Exam: Vitals:   11/24/23 1424  BP: (!) 143/85  Pulse: (!) 119  Resp: (!) 27  Temp: (!) 101.7 F (38.7 C)  TempSrc: Oral  SpO2: 100%    Physical Exam Constitutional:      General: She is not in acute distress.    Appearance: Normal appearance.  HENT:     Head: Normocephalic and atraumatic.     Mouth/Throat:     Mouth: Mucous membranes are moist.     Pharynx: Oropharynx is clear.  Eyes:     Extraocular Movements: Extraocular movements intact.     Pupils: Pupils are equal, round, and reactive to light.  Cardiovascular:     Rate and Rhythm: Regular rhythm. Tachycardia present.     Pulses: Normal pulses.     Heart sounds: Normal heart sounds.  Pulmonary:     Effort: Pulmonary effort is normal. No respiratory distress.     Breath sounds: Wheezing present.     Comments: Tachypnea Abdominal:     General: Bowel sounds are normal. There is no distension.     Palpations: Abdomen is soft.     Tenderness: There is no abdominal tenderness.  Musculoskeletal:        General: No swelling or deformity.  Skin:    General: Skin is warm and dry.  Neurological:     General: No focal deficit present.     Mental Status: Mental status is at baseline.    Labs on Admission: I have personally reviewed following labs and imaging studies  CBC: Recent Labs  Lab 11/24/23 1441  WBC 9.7   NEUTROABS 8.7*  HGB 16.3*  HCT 47.7*  MCV 84.7  PLT 249    Basic Metabolic Panel: Recent Labs  Lab 11/24/23 1441  NA 134*  K 3.5  CL 99  CO2 21*  GLUCOSE 105*  BUN 14  CREATININE 0.63  CALCIUM 9.5    GFR: Estimated Creatinine Clearance: 88.9 mL/min (by C-G formula based on SCr of 0.63 mg/dL).  Liver Function Tests: No results for input(s): "AST", "ALT", "ALKPHOS", "BILITOT", "PROT", "ALBUMIN" in the last 168 hours.  Urine analysis:    Component Value Date/Time   COLORURINE RED (A) 11/30/2015 0106   APPEARANCEUR CLOUDY (A) 11/30/2015 0106   LABSPEC 1.031 (H) 11/30/2015 0106   PHURINE 5.5 11/30/2015 0106   GLUCOSEU NEGATIVE 11/30/2015 0106   HGBUR LARGE (A) 11/30/2015 0106   BILIRUBINUR MODERATE (A) 11/30/2015 0106   KETONESUR NEGATIVE 11/30/2015 0106   PROTEINUR 100 (A) 11/30/2015 0106   UROBILINOGEN 1.0 03/01/2013 0850   NITRITE NEGATIVE 11/30/2015 0106   LEUKOCYTESUR SMALL (A) 11/30/2015 0106    Radiological Exams on Admission: DG Chest 2 View Result Date: 11/24/2023 CLINICAL DATA:  Cough.  Shortness of breath. EXAM: CHEST - 2 VIEW COMPARISON:  03/04/2012. FINDINGS: Bilateral lung fields are clear. Bilateral costophrenic angles are clear. Normal cardio-mediastinal silhouette. No acute osseous abnormalities. The soft tissues are within normal limits. IMPRESSION: No active cardiopulmonary disease. Electronically Signed   By: Jules Schick M.D.   On: 11/24/2023 13:44   EKG: Independently reviewed.  Sinus tachycardia at 115 bpm.  Nonspecific T wave changes.  Missing lead V5.  Assessment/Plan Principal Problem:   Acute respiratory failure with hypoxia (HCC) Active Problems:   Essential hypertension   Asthma exacerbation   Influenza A   Acute respiratory failure with hypoxia Asthma exacerbation Influenza A > Patient presenting with flulike symptoms for the past 2 days.  Went to urgent care today found to be hypoxic and sent to the ED. > Requiring 4 to 6 L  to maintain saturations.  Some wheezing.  History of asthma but has not had issues with this for some years. > Chest x-ray without acute abnormality.  Presumably combination of influenza A alone in combination with asthma exacerbation. > Did have recent travel to [place] in the car, if fails to improve with treatment of most likely etiology of fluid in abdomen estimation, or condition deteriorates, can consider workup for PE; but is significantly less likely at this time. - Monitor on Progressive bed overnight - Continue supplemental oxygen, wean as tolerated - Tamiflu - Prednisone - Scheduled Atrovent - As needed albuterol - Supportive care Addendum > Notified by patient's nurse that she was having worsening tachypnea, tachycardia, fever despite initial interventions.  And increased work  of breathing overall. > Nurse started patient on nonrebreather with some improvement in work of breathing and saturations.  But is having higher oxygen requirement. > Tachycardia is in the setting of progressive fever despite multiple doses of Tylenol, will try ibuprofen as well.  Fever now 102.7.  Nebulizer treatments may also be contributing. > Will repeat chest x-ray while getting patient on BiPAP to help with her work of breathing and prevent her from getting tired out. > Considering evaluating with CTA given her worsening respiratory status.  Will hold off with initial improvement but will check D-dimer and if elevated will get CTA. - Continue with supplemental oxygen, remains suitable for progressive unit - Ibuprofen, chest x-ray, D-dimer, ABG - BiPAP - Plan for CTA chest if D-dimer elevated and possibly standard CT chest if not.  Hypertension - Continue home amlodipine, lisinopril  DVT prophylaxis: Lovenox Code Status:   Full Family Communication:  Updated at bedside  Disposition Plan:   Patient is from:  Home  Anticipated DC to:  Home  Anticipated DC date:  1 to 3 days  Anticipated DC  barriers: None  Consults called:  None Admission status:  Observation, Progressive  Severity of Illness: The appropriate patient status for this patient is OBSERVATION. Observation status is judged to be reasonable and necessary in order to provide the required intensity of service to ensure the patient's safety. The patient's presenting symptoms, physical exam findings, and initial radiographic and laboratory data in the context of their medical condition is felt to place them at decreased risk for further clinical deterioration. Furthermore, it is anticipated that the patient will be medically stable for discharge from the hospital within 2 midnights of admission.    Synetta Fail MD [facility]  How to contact the [facility] Attending or Consulting provider 7A - 7P or covering provider during after hours 7P -7A, for this patient?   Check the care team in [facility] and look for a) attending/consulting [facility] provider listed and b) the [facility] team listed Log into [email or web address] and use [place]'s universal password to access. If you do not have the password, please contact the hospital operator. Locate the [facility] provider you are looking for under [facility] and page to a number that you can be directly reached. If you still have difficulty reaching the provider, please page the [facility] (Director on Call) for the Hospitalists listed on amion for assistance.  11/24/2023, 4:33 PM

## 2023-11-25 ENCOUNTER — Encounter (HOSPITAL_COMMUNITY): Payer: Self-pay | Admitting: Internal Medicine

## 2023-11-25 ENCOUNTER — Observation Stay (HOSPITAL_COMMUNITY): Payer: Self-pay

## 2023-11-25 LAB — CBC
HCT: 48.7 % — ABNORMAL HIGH (ref 36.0–46.0)
Hemoglobin: 16.6 g/dL — ABNORMAL HIGH (ref 12.0–15.0)
MCH: 28.9 pg (ref 26.0–34.0)
MCHC: 34.1 g/dL (ref 30.0–36.0)
MCV: 84.8 fL (ref 80.0–100.0)
Platelets: 237 10*3/uL (ref 150–400)
RBC: 5.74 MIL/uL — ABNORMAL HIGH (ref 3.87–5.11)
RDW: 12.8 % (ref 11.5–15.5)
WBC: 7.1 10*3/uL (ref 4.0–10.5)
nRBC: 0 % (ref 0.0–0.2)

## 2023-11-25 LAB — COMPREHENSIVE METABOLIC PANEL
ALT: 25 U/L (ref 0–44)
AST: 24 U/L (ref 15–41)
Albumin: 3.9 g/dL (ref 3.5–5.0)
Alkaline Phosphatase: 39 U/L (ref 38–126)
Anion gap: 15 (ref 5–15)
BUN: 15 mg/dL (ref 6–20)
CO2: 26 mmol/L (ref 22–32)
Calcium: 9.7 mg/dL (ref 8.9–10.3)
Chloride: 97 mmol/L — ABNORMAL LOW (ref 98–111)
Creatinine, Ser: 0.9 mg/dL (ref 0.44–1.00)
GFR, Estimated: >60 mL/min (ref >60)
Glucose, Bld: 108 mg/dL — ABNORMAL HIGH (ref 70–99)
Potassium: 3.7 mmol/L (ref 3.5–5.1)
Sodium: 138 mmol/L (ref 135–145)
Total Bilirubin: 0.9 mg/dL (ref 0.0–1.2)
Total Protein: 7.7 g/dL (ref 6.5–8.1)

## 2023-11-25 LAB — HIV ANTIBODY (ROUTINE TESTING W REFLEX): HIV Screen 4th Generation wRfx: NONREACTIVE

## 2023-11-25 MED ORDER — WHITE PETROLATUM EX OINT
TOPICAL_OINTMENT | CUTANEOUS | Status: DC | PRN
  Administered 2023-11-25: 0.2 via TOPICAL
  Filled 2023-11-25: qty 28.35

## 2023-11-25 MED ORDER — IOHEXOL 350 MG/ML SOLN
75.0000 mL | Freq: Once | INTRAVENOUS | Status: AC | PRN
  Administered 2023-11-25: 75 mL via INTRAVENOUS

## 2023-11-25 MED ORDER — GUAIFENESIN-DM 100-10 MG/5ML PO SYRP
5.0000 mL | ORAL_SOLUTION | ORAL | Status: DC | PRN
  Administered 2023-11-25 – 2023-11-26 (×5): 5 mL via ORAL
  Filled 2023-11-25 (×5): qty 5

## 2023-11-25 MED ORDER — CLOBETASOL PROPIONATE 0.05 % EX OINT
TOPICAL_OINTMENT | Freq: Two times a day (BID) | CUTANEOUS | Status: DC
  Filled 2023-11-25 (×2): qty 15

## 2023-11-25 NOTE — Plan of Care (Signed)

## 2023-11-25 NOTE — Progress Notes (Signed)
 PROGRESS NOTE    Kara Ramsey  ZOX:[id number] DOB: 1971-01-13 DOA: 11/24/2023 PCP: No primary care provider on file.   Brief Narrative:  This 53 years old female with PMH significant for hypertension, asthma presented to the ED with complaints of shortness of breath. Patient recently went to a trip to [place] and afterwards her husband got URI symptoms for 4 days,  later she has developed similar symptoms. She describes having cough, fever, chills, body aches, sore throat.  She was seen in urgent care where she was negative for flu and strep and negative chest x-ray.  Patient was hypoxic and was sent to the ED for further evaluation.  In the ED she is positive for influenza A.  Chest x-ray shows no acute abnormality.  Patient was started on Tamiflu and admitted for further evaluation.  Assessment & Plan:   Principal Problem:   Acute respiratory failure with hypoxia (HCC) Active Problems:   Essential hypertension   Asthma exacerbation   Influenza A  Acute hypoxic respiratory failure secondary to influenza A: Asthma exacerbation: Patient presented with flu like symptoms for the last 2 days. She went to urgent care, she continued to be hypoxic so sent in ED. Patient was severely hypoxic and has required nonrebreather and BiPAP temporarily. She still requires up to 4 L of supplemental oxygen to maintain saturation. Patient does have history of asthma but has not had any issues within the last 4 years.   Chest x-ray without acute abnormality. Continue Tamiflu for 5 days. Continue prednisone for 5 days. Continue with scheduled and as needed Atrovent nebulization. Obtain CTA chest to rule out pulmonary embolism.  Hypertension Continue home amlodipine, lisinopril  DVT prophylaxis: Lovenox Code Status: Full code Family Communication: No family at bedside. Disposition Plan:    Status is: Observation The patient remains OBS appropriate and will d/c before 2 midnights.  Admitted for  acute hypoxic respiratory failure likely secondary to influenza A.   Consultants:  None  Procedures: CTA chest  Antimicrobials:  Anti-infectives (From admission, onward)    Start     Dose/Rate Route Frequency Ordered Stop   11/25/23 1000  oseltamivir (TAMIFLU) capsule 75 mg        75 mg Oral 2 times daily 11/24/23 1628 11/30/23 0959   11/24/23 1630  oseltamivir (TAMIFLU) capsule 75 mg        75 mg Oral  Once 11/24/23 1627 11/24/23 1633      Subjective: Patient was seen and examined at bedside.  Overnight events noted.   Patient reports feeling better, She still remains tachycardic and tachypneic.  Denies any chest pain.  Objective: Vitals:   11/25/23 0326 11/25/23 0329 11/25/23 0736 11/25/23 1004  BP:  127/75  (!) 144/88  Pulse:  99  (!) 110  Resp:  (!) 21  17  Temp:  100.2 F (37.9 C)  98.1 F (36.7 C)  TempSrc:    Oral  SpO2: 95% 96% 99% 93%  Weight:    85.3 kg  Height:    5\' 4"  (1.626 m)    Intake/Output Summary (Last 24 hours) at 11/25/2023 1119 Last data filed at 11/25/2023 1100 Gross per 24 hour  Intake 150 ml  Output --  Net 150 ml   Filed Weights   11/25/23 1004  Weight: 85.3 kg    Examination:  General exam: Appears calm and comfortable, not in any acute distress. Respiratory system: Clear to auscultation. Respiratory effort normal.  RR 15 Cardiovascular system: S1 & S2 heard,  RRR. No JVD, murmurs, rubs, gallops or clicks.  Gastrointestinal system: Abdomen is non distended, soft and non tender. Normal bowel sounds heard. Central nervous system: Alert and oriented X 3. No focal neurological deficits. Extremities: No edema, no cyanosis, no clubbing. Skin: No rashes, lesions or ulcers Psychiatry: Judgement and insight appear normal. Mood & affect appropriate.     Data Reviewed: I have personally reviewed following labs and imaging studies  CBC: Recent Labs  Lab 11/24/23 1441 11/25/23 0210  WBC 9.7 7.1  NEUTROABS 8.7*  --   HGB 16.3* 16.6*   HCT 47.7* 48.7*  MCV 84.7 84.8  PLT 249 237   Basic Metabolic Panel: Recent Labs  Lab 11/24/23 1441 11/25/23 0210  NA 134* 138  K 3.5 3.7  CL 99 97*  CO2 21* 26  GLUCOSE 105* 108*  BUN 14 15  CREATININE 0.63 0.90  CALCIUM 9.5 9.7   GFR: Estimated Creatinine Clearance: 79 mL/min (by C-G formula based on SCr of 0.9 mg/dL). Liver Function Tests: Recent Labs  Lab 11/25/23 0210  AST 24  ALT 25  ALKPHOS 39  BILITOT 0.9  PROT 7.7  ALBUMIN 3.9   No results for input(s): "LIPASE", "AMYLASE" in the last 168 hours. No results for input(s): "AMMONIA" in the last 168 hours. Coagulation Profile: No results for input(s): "INR", "PROTIME" in the last 168 hours. Cardiac Enzymes: No results for input(s): "CKTOTAL", "CKMB", "CKMBINDEX", "TROPONINI" in the last 168 hours. BNP (last 3 results) No results for input(s): "PROBNP" in the last 8760 hours. HbA1C: No results for input(s): "HGBA1C" in the last 72 hours. CBG: No results for input(s): "GLUCAP" in the last 168 hours. Lipid Profile: No results for input(s): "CHOL", "HDL", "LDLCALC", "TRIG", "CHOLHDL", "LDLDIRECT" in the last 72 hours. Thyroid Function Tests: No results for input(s): "TSH", "T4TOTAL", "FREET4", "T3FREE", "THYROIDAB" in the last 72 hours. Anemia Panel: No results for input(s): "VITAMINB12", "FOLATE", "FERRITIN", "TIBC", "IRON", "RETICCTPCT" in the last 72 hours. Sepsis Labs: No results for input(s): "PROCALCITON", "LATICACIDVEN" in the last 168 hours.  Recent Results (from the past 240 hours)  Resp panel by RT-PCR (RSV, Flu A&B, Covid)     Status: Abnormal   Collection Time: 11/24/23  2:15 PM   Specimen: Nasal Swab  Result Value Ref Range Status   SARS Coronavirus 2 by RT PCR NEGATIVE NEGATIVE Final   Influenza A by PCR POSITIVE (A) NEGATIVE Final   Influenza B by PCR NEGATIVE NEGATIVE Final    Comment: (NOTE) The Xpert Xpress SARS-CoV-2/FLU/RSV plus assay is intended as an aid in the diagnosis of  influenza from Nasopharyngeal swab specimens and should not be used as a sole basis for treatment. Nasal washings and aspirates are unacceptable for Xpert Xpress SARS-CoV-2/FLU/RSV testing.  Fact Sheet for Patients: BloggerCourse.com  Fact Sheet for Healthcare Providers: SeriousBroker.it  This test is not yet approved or cleared by the [place] FDA and has been authorized for detection and/or diagnosis of SARS-CoV-2 by FDA under an Emergency Use Authorization (EUA). This EUA will remain in effect (meaning this test can be used) for the duration of the COVID-19 declaration under Section 564(b)(1) of the Act, 21 U.S.C. section 360bbb-3(b)(1), unless the authorization is terminated or revoked.     Resp Syncytial Virus by PCR NEGATIVE NEGATIVE Final    Comment: (NOTE) Fact Sheet for Patients: BloggerCourse.com  Fact Sheet for Healthcare Providers: SeriousBroker.it  This test is not yet approved or cleared by the [place] FDA and has been authorized for detection  and/or diagnosis of SARS-CoV-2 by FDA under an Emergency Use Authorization (EUA). This EUA will remain in effect (meaning this test can be used) for the duration of the COVID-19 declaration under Section 564(b)(1) of the Act, 21 U.S.C. section 360bbb-3(b)(1), unless the authorization is terminated or revoked.  Performed at [facility], [address] [place], [place] [address]   Culture, blood (routine x 2)     Status: None (Preliminary result)   Collection Time: 11/24/23  2:15 PM   Specimen: BLOOD RIGHT FOREARM  Result Value Ref Range Status   Specimen Description BLOOD RIGHT FOREARM  Final   Special Requests   Final    BOTTLES DRAWN AEROBIC AND ANAEROBIC Blood Culture results may not be optimal due to an inadequate volume of blood received in culture bottles   Culture   Final    NO GROWTH < 24  HOURS Performed at [facility], [address], [place], [place] [address]    Report Status PENDING  Incomplete  Culture, blood (routine x 2)     Status: None (Preliminary result)   Collection Time: 11/24/23  2:20 PM   Specimen: BLOOD RIGHT HAND  Result Value Ref Range Status   Specimen Description BLOOD RIGHT HAND  Final   Special Requests   Final    BOTTLES DRAWN AEROBIC AND ANAEROBIC Blood Culture results may not be optimal due to an inadequate volume of blood received in culture bottles   Culture   Final    NO GROWTH < 24 HOURS Performed at [facility], [address], [place], [place] [address]    Report Status PENDING  Incomplete  MRSA Next Gen by PCR, Nasal     Status: None   Collection Time: 11/24/23  9:44 PM   Specimen: Nasal Mucosa; Nasal Swab  Result Value Ref Range Status   MRSA by PCR Next Gen NOT DETECTED NOT DETECTED Final    Comment: (NOTE) The GeneXpert MRSA Assay (FDA approved for NASAL specimens only), is one component of a comprehensive MRSA colonization surveillance program. It is not intended to diagnose MRSA infection nor to guide or monitor treatment for MRSA infections. Test performance is not FDA approved in patients less than 69 years old. Performed at [facility], [address], [place], [place] [address]     Radiology Studies: DG CHEST PORT 1 VIEW Result Date: 11/24/2023 CLINICAL DATA:  [id number] Acute respiratory failure with hypoxia [facility]) [id number] EXAM: PORTABLE CHEST 1 VIEW COMPARISON:  November 24, 2023, Mar 04, 2012 FINDINGS: The cardiomediastinal silhouette is unchanged in contour. No pleural effusion. No pneumothorax. Mildly prominent bronchitic markings with LEFT basilar reticular nodularity. IMPRESSION: Mildly prominent bronchitic markings favored to reflect underlying small airways disease. Electronically Signed   By: Meda Klinefelter M.D.   On: 11/24/2023 18:24   DG Chest 2 View Result Date: 11/24/2023 CLINICAL DATA:   Cough.  Shortness of breath. EXAM: CHEST - 2 VIEW COMPARISON:  03/04/2012. FINDINGS: Bilateral lung fields are clear. Bilateral costophrenic angles are clear. Normal cardio-mediastinal silhouette. No acute osseous abnormalities. The soft tissues are within normal limits. IMPRESSION: No active cardiopulmonary disease. Electronically Signed   By: Jules Schick M.D.   On: 11/24/2023 13:44    Scheduled Meds:  amLODipine  10 mg Oral Daily   enoxaparin (LOVENOX) injection  40 mg Subcutaneous Q24H   ipratropium  0.5 mg Nebulization Q6H WA   lisinopril  20 mg Oral Daily   oseltamivir  75  mg Oral BID   predniSONE  50 mg Oral Q breakfast   sodium chloride flush  3 mL Intravenous Q12H   Continuous Infusions:   LOS: 0 days    Time spent: 50 mins    Willeen Niece, MD [facility]   If 7PM-7AM, please contact night-coverage

## 2023-11-25 NOTE — Progress Notes (Signed)
 Pt taken off for sips of water. Pt tol well. RA Sats 89%. Pt returned to BiPAP for continued tachypnea and some SOB.

## 2023-11-26 LAB — PROCALCITONIN: Procalcitonin: <0.1 ng/mL

## 2023-11-26 MED ORDER — GUAIFENESIN-DM 100-10 MG/5ML PO SYRP: 5.0000 mL | ORAL_SOLUTION | ORAL | 0 refills | Status: AC | PRN

## 2023-11-26 MED ORDER — BENZONATATE 100 MG PO CAPS: 100.0000 mg | ORAL_CAPSULE | Freq: Three times a day (TID) | ORAL | 0 refills | Status: AC

## 2023-11-26 MED ORDER — PREDNISONE 50 MG PO TABS: 50.0000 mg | ORAL_TABLET | Freq: Every day | ORAL | 0 refills | Status: AC

## 2023-11-26 MED ORDER — IPRATROPIUM BROMIDE 0.02 % IN SOLN
0.5000 mg | Freq: Two times a day (BID) | RESPIRATORY_TRACT | Status: DC
  Administered 2023-11-26: 0.5 mg via RESPIRATORY_TRACT
  Filled 2023-11-26: qty 2.5

## 2023-11-26 MED ORDER — BENZONATATE 100 MG PO CAPS
100.0000 mg | ORAL_CAPSULE | Freq: Three times a day (TID) | ORAL | Status: DC
  Administered 2023-11-26 – 2023-11-27 (×3): 100 mg via ORAL
  Filled 2023-11-26 (×3): qty 1

## 2023-11-26 MED ORDER — OSELTAMIVIR PHOSPHATE 75 MG PO CAPS: 75.0000 mg | ORAL_CAPSULE | Freq: Two times a day (BID) | ORAL | 0 refills | Status: AC

## 2023-11-26 NOTE — Progress Notes (Signed)
 PROGRESS NOTE    Kara Ramsey  UEA:[id number] DOB: [birth year] DOA: 11/24/2023 PCP: No primary care provider on file.   Brief Narrative:  This 53 years old female with PMH significant for hypertension, asthma presented to the ED with complaints of shortness of breath. Patient recently went to a trip to [place] and afterwards her husband got URI symptoms for 4 days,  later she has developed similar symptoms. She describes having cough, fever, chills, body aches, sore throat.  She was seen in urgent care where she was negative for flu and strep and negative chest x-ray.  Patient was hypoxic and was sent to the ED for further evaluation.  In the ED she is positive for influenza A.  Chest x-ray shows no acute abnormality.  Patient was started on Tamiflu and admitted for further evaluation.  Assessment & Plan:   Principal Problem:   Acute respiratory failure with hypoxia (HCC) Active Problems:   Essential hypertension   Asthma exacerbation   Influenza A  Acute hypoxic respiratory failure secondary to influenza A: Asthma exacerbation: Patient presented with flu like symptoms for the last 2 days. She went to urgent care, she continued to be hypoxic so sent in ED. Patient was severely hypoxic and has required nonrebreather and BiPAP temporarily. She still requires up to 4 L of supplemental oxygen to maintain saturation. Patient does have history of asthma but has not had any issues within the last 4 years.   Chest x-ray without acute abnormality. Continue Tamiflu for 5 days. Continue prednisone for 5 days. Continue with scheduled and as needed Atrovent nebulization. CTA chest ruled out pulmonary embolism but shows findings consistent with multifocal pneumonia. Obtain procalcitonin.  Likely due to influenza A. Continue supplemental oxygen and wean as tolerated. Check SpO2 while ambulation to check if she qualifies for home oxygen.  Hypertension: Continue home amlodipine,  lisinopril.  DVT prophylaxis: Lovenox Code Status: Full code Family Communication: No family at bedside. Disposition Plan:   Status is: Inpatient Remains inpatient appropriate because:     Admitted for acute hypoxic respiratory failure likely secondary to influenza A.   Consultants:  None  Procedures: CTA chest  Antimicrobials:  Anti-infectives (From admission, onward)    Start     Dose/Rate Route Frequency Ordered Stop   11/25/23 1000  oseltamivir (TAMIFLU) capsule 75 mg        75 mg Oral 2 times daily 11/24/23 1628 11/30/23 0959   11/24/23 1630  oseltamivir (TAMIFLU) capsule 75 mg        75 mg Oral  Once 11/24/23 1627 11/24/23 1633      Subjective: Patient was seen and examined at bedside.  Overnight events noted.   Patient reports feeling better,  still remains short of breath with exertion.  Denies any chest pain.  Objective: Vitals:   11/26/23 0749 11/26/23 0754 11/26/23 0841 11/26/23 1052  BP: 132/79  116/70 (!) 123/91  Pulse: 86     Resp: 19     Temp: 98 F (36.7 C)   98 F (36.7 C)  TempSrc: Oral   Oral  SpO2: 100% 97%  97%  Weight:      Height:        Intake/Output Summary (Last 24 hours) at 11/26/2023 1158 Last data filed at 11/25/2023 1219 Gross per 24 hour  Intake 240 ml  Output --  Net 240 ml   Filed Weights   11/25/23 1004  Weight: 85.3 kg    Examination:  General exam: Appears comfortable, not  in any acute distress. Respiratory system: CTA Bilaterally. Respiratory effort normal.  RR 15 Cardiovascular system: S1 & S2 heard, RRR. No JVD, murmurs, rubs, gallops or clicks.  Gastrointestinal system: Abdomen is non distended, soft and non tender. Normal bowel sounds heard. Central nervous system: Alert and oriented X 3. No focal neurological deficits. Extremities: No edema, no cyanosis, no clubbing. Skin: No rashes, lesions or ulcers Psychiatry: Judgement and insight appear normal. Mood & affect appropriate.     Data Reviewed: I have  personally reviewed following labs and imaging studies  CBC: Recent Labs  Lab 11/24/23 1441 11/25/23 0210  WBC 9.7 7.1  NEUTROABS 8.7*  --   HGB 16.3* 16.6*  HCT 47.7* 48.7*  MCV 84.7 84.8  PLT 249 237   Basic Metabolic Panel: Recent Labs  Lab 11/24/23 1441 11/25/23 0210  NA 134* 138  K 3.5 3.7  CL 99 97*  CO2 21* 26  GLUCOSE 105* 108*  BUN 14 15  CREATININE 0.63 0.90  CALCIUM 9.5 9.7   GFR: Estimated Creatinine Clearance: 79 mL/min (by C-G formula based on SCr of 0.9 mg/dL). Liver Function Tests: Recent Labs  Lab 11/25/23 0210  AST 24  ALT 25  ALKPHOS 39  BILITOT 0.9  PROT 7.7  ALBUMIN 3.9   No results for input(s): "LIPASE", "AMYLASE" in the last 168 hours. No results for input(s): "AMMONIA" in the last 168 hours. Coagulation Profile: No results for input(s): "INR", "PROTIME" in the last 168 hours. Cardiac Enzymes: No results for input(s): "CKTOTAL", "CKMB", "CKMBINDEX", "TROPONINI" in the last 168 hours. BNP (last 3 results) No results for input(s): "PROBNP" in the last 8760 hours. HbA1C: No results for input(s): "HGBA1C" in the last 72 hours. CBG: No results for input(s): "GLUCAP" in the last 168 hours. Lipid Profile: No results for input(s): "CHOL", "HDL", "LDLCALC", "TRIG", "CHOLHDL", "LDLDIRECT" in the last 72 hours. Thyroid Function Tests: No results for input(s): "TSH", "T4TOTAL", "FREET4", "T3FREE", "THYROIDAB" in the last 72 hours. Anemia Panel: No results for input(s): "VITAMINB12", "FOLATE", "FERRITIN", "TIBC", "IRON", "RETICCTPCT" in the last 72 hours. Sepsis Labs: Recent Labs  Lab 11/26/23 0919  PROCALCITON <0.10    Recent Results (from the past 240 hours)  Resp panel by RT-PCR (RSV, Flu A&B, Covid)     Status: Abnormal   Collection Time: 11/24/23  2:15 PM   Specimen: Nasal Swab  Result Value Ref Range Status   SARS Coronavirus 2 by RT PCR NEGATIVE NEGATIVE Final   Influenza A by PCR POSITIVE (A) NEGATIVE Final   Influenza B by  PCR NEGATIVE NEGATIVE Final    Comment: (NOTE) The Xpert Xpress SARS-CoV-2/FLU/RSV plus assay is intended as an aid in the diagnosis of influenza from Nasopharyngeal swab specimens and should not be used as a sole basis for treatment. Nasal washings and aspirates are unacceptable for Xpert Xpress SARS-CoV-2/FLU/RSV testing.  Fact Sheet for Patients: BloggerCourse.com  Fact Sheet for Healthcare Providers: SeriousBroker.it  This test is not yet approved or cleared by the [place] FDA and has been authorized for detection and/or diagnosis of SARS-CoV-2 by FDA under an Emergency Use Authorization (EUA). This EUA will remain in effect (meaning this test can be used) for the duration of the COVID-19 declaration under Section 564(b)(1) of the Act, 21 U.S.C. section 360bbb-3(b)(1), unless the authorization is terminated or revoked.     Resp Syncytial Virus by PCR NEGATIVE NEGATIVE Final    Comment: (NOTE) Fact Sheet for Patients: BloggerCourse.com  Fact Sheet for Healthcare Providers: SeriousBroker.it  This test is not yet approved or cleared by the [place] and has been authorized for detection and/or diagnosis of SARS-CoV-2 by FDA under an Emergency Use Authorization (EUA). This EUA will remain in effect (meaning this test can be used) for the duration of the COVID-19 declaration under Section 564(b)(1) of the Act, 21 U.S.C. section 360bbb-3(b)(1), unless the authorization is terminated or revoked.  Performed at [facility], [address] [place], [place] [address]   Culture, blood (routine x 2)     Status: None (Preliminary result)   Collection Time: 11/24/23  2:15 PM   Specimen: BLOOD RIGHT FOREARM  Result Value Ref Range Status   Specimen Description BLOOD RIGHT FOREARM  Final   Special Requests   Final    BOTTLES DRAWN AEROBIC AND ANAEROBIC Blood  Culture results may not be optimal due to an inadequate volume of blood received in culture bottles   Culture   Final    NO GROWTH 2 DAYS Performed at [facility], [address], [place], [place] [address]    Report Status PENDING  Incomplete  Culture, blood (routine x 2)     Status: None (Preliminary result)   Collection Time: 11/24/23  2:20 PM   Specimen: BLOOD RIGHT HAND  Result Value Ref Range Status   Specimen Description BLOOD RIGHT HAND  Final   Special Requests   Final    BOTTLES DRAWN AEROBIC AND ANAEROBIC Blood Culture results may not be optimal due to an inadequate volume of blood received in culture bottles   Culture   Final    NO GROWTH 2 DAYS Performed at [facility], [address] [place], [place] [address]    Report Status PENDING  Incomplete  MRSA Next Gen by PCR, Nasal     Status: None   Collection Time: 11/24/23  9:44 PM   Specimen: Nasal Mucosa; Nasal Swab  Result Value Ref Range Status   MRSA by PCR Next Gen NOT DETECTED NOT DETECTED Final    Comment: (NOTE) The GeneXpert MRSA Assay (FDA approved for NASAL specimens only), is one component of a comprehensive MRSA colonization surveillance program. It is not intended to diagnose MRSA infection nor to guide or monitor treatment for MRSA infections. Test performance is not FDA approved in patients less than 54 years old. Performed at [facility], [address] [place], [place] [address]     Radiology Studies: CT Angio Chest Pulmonary Embolism (PE) W or WO Contrast Result Date: 11/25/2023 CLINICAL DATA:  Pulmonary embolism (PE) suspected, high prob EXAM: CT ANGIOGRAPHY CHEST WITH CONTRAST TECHNIQUE: Multidetector CT imaging of the chest was performed using the standard protocol during bolus administration of intravenous contrast. Multiplanar CT image reconstructions and MIPs were obtained to evaluate the vascular anatomy. RADIATION DOSE REDUCTION: This exam was performed according to the  departmental dose-optimization program which includes automated exposure control, adjustment of the mA and/or kV according to patient size and/or use of iterative reconstruction technique. CONTRAST:  75mL OMNIPAQUE IOHEXOL 350 MG/ML SOLN COMPARISON:  Chest x-ray 11/24/2023 FINDINGS: Cardiovascular: Satisfactory opacification of the pulmonary arteries to the segmental level. No evidence of pulmonary embolism. Thoracic aorta is nonaneurysmal. Minimal aortic atherosclerosis. Normal heart size. No pericardial effusion. Mediastinum/Nodes: Mildly enlarged bilateral hilar lymph nodes measuring 1.4 cm on the right and 1.0 cm on the left (series 5, image 62). No axillary or mediastinal lymphadenopathy. Thyroid gland, trachea, and esophagus within normal limits. Lungs/Pleura: Right middle  lobe airspace consolidation. Additional areas of patchy airspace opacity and ground-glass attenuation within both lungs. No pleural effusion or pneumothorax. Upper Abdomen: No acute abnormality. Musculoskeletal: No chest wall abnormality. No acute or significant osseous findings. Review of the MIP images confirms the above findings. IMPRESSION: 1. No evidence of pulmonary embolism. 2. Right middle lobe airspace consolidation with additional areas of patchy airspace opacity and ground-glass attenuation within both lungs. Findings are most compatible with multifocal pneumonia. 3. Mildly enlarged bilateral hilar lymph nodes, likely reactive. 4. Aortic atherosclerosis (ICD10-I70.0). Electronically Signed   By: Duanne Guess D.O.   On: 11/25/2023 15:44   DG CHEST PORT 1 VIEW Result Date: 11/24/2023 CLINICAL DATA:  [id number] Acute respiratory failure with hypoxia [facility]) [id number] EXAM: PORTABLE CHEST 1 VIEW COMPARISON:  November 24, 2023, Mar 04, 2012 FINDINGS: The cardiomediastinal silhouette is unchanged in contour. No pleural effusion. No pneumothorax. Mildly prominent bronchitic markings with LEFT basilar reticular nodularity. IMPRESSION:  Mildly prominent bronchitic markings favored to reflect underlying small airways disease. Electronically Signed   By: Meda Klinefelter M.D.   On: 11/24/2023 18:24   DG Chest 2 View Result Date: 11/24/2023 CLINICAL DATA:  Cough.  Shortness of breath. EXAM: CHEST - 2 VIEW COMPARISON:  03/04/2012. FINDINGS: Bilateral lung fields are clear. Bilateral costophrenic angles are clear. Normal cardio-mediastinal silhouette. No acute osseous abnormalities. The soft tissues are within normal limits. IMPRESSION: No active cardiopulmonary disease. Electronically Signed   By: Jules Schick M.D.   On: 11/24/2023 13:44    Scheduled Meds:  amLODipine  10 mg Oral Daily   clobetasol ointment   Topical BID   enoxaparin (LOVENOX) injection  40 mg Subcutaneous Q24H   ipratropium  0.5 mg Nebulization BID   lisinopril  20 mg Oral Daily   oseltamivir  75 mg Oral BID   predniSONE  50 mg Oral Q breakfast   sodium chloride flush  3 mL Intravenous Q12H   Continuous Infusions:   LOS: 1 day    Time spent: 35 mins    Willeen Niece, MD [facility]   If 7PM-7AM, please contact night-coverage

## 2023-11-26 NOTE — Discharge Instructions (Signed)
 Advised to take prednisone 50 mg daily for 2 more days to complete 5-day treatment. Advised to take Tamiflu 75 mg twice daily for 3 more days.

## 2023-11-26 NOTE — Plan of Care (Signed)

## 2023-11-26 NOTE — Progress Notes (Signed)
 9SATURATION QUALIFICATIONS: (This note is used to comply with regulatory documentation for home oxygen)  Patient Saturations on Room Air at Rest = 95%  Patient Saturations on Room Air while Ambulating = 94%   Please briefly explain why patient needs home oxygen: patient does not qualify for home oxygen.

## 2023-11-26 NOTE — Discharge Summary (Signed)
 Physician Discharge Summary  Kara Ramsey BMW:[id number] DOB: 07-04-1971 DOA: 11/24/2023  PCP: No primary care provider on file.  Admit date: 11/24/2023  Discharge date: 11/27/2023  Admitted From: Home.  Disposition: Home  Recommendations for Outpatient Follow-up:  Follow up with PCP in 1-2 weeks. Please obtain BMP/CBC in one week. Advised to take prednisone 50 mg daily for 2 more days to complete 5-day treatment. Advised to take Tamiflu 75 mg twice daily for 3 more days.  Home Health: None Equipment/Devices: None  Discharge Condition: Stable CODE STATUS:Full code Diet recommendation: Heart Healthy   Brief [facility] Course: This 53 years old female with PMH significant for hypertension, asthma presented to the ED with complaints of shortness of breath. Patient recently went to a trip to [place] and afterwards her husband got URI symptoms for 4 days, later she has developed similar symptoms. She describes having cough, fever, chills, body aches, sore throat. She was seen in urgent care where she was negative for strep and negative chest x-ray. Patient was hypoxic and was sent to the ED for further evaluation. In the ED she is positive for influenza A. Chest x-ray shows no acute abnormality. Patient was started on Tamiflu and admitted for further evaluation.  Patient was continued on Albuterol nebulization, steroids and Tamiflu.  Procalcitonin normal.  Patient has made significant improvement.  CTA chest was done to rule out pulmonary embolism, which shows multifocal pneumonia consistent with viral infection.  Ambulated without need for supplemental oxygen.  Patient feels better and wants to be discharged.  Patient is being discharged home.  Discharge Diagnoses:  Principal Problem:   Acute respiratory failure with hypoxia (HCC) Active Problems:   Essential hypertension   Asthma exacerbation   Influenza A  Acute hypoxic respiratory failure secondary to influenza A: Asthma  exacerbation: Patient presented with flu like symptoms for the last 2 days. She went to urgent care, she continued to be hypoxic so sent in ED. Patient was severely hypoxic and has required nonrebreather and BiPAP temporarily. She still requires up to 4 L of supplemental oxygen to maintain saturation. Patient does have history of asthma but has not had any issues within the last 4 years.   Chest x-ray without acute abnormality. Continue Tamiflu for 5 days. Continue prednisone for 5 days. Continue with scheduled and as needed Atrovent nebulization. CTA chest ruled out pulmonary embolism but shows findings consistent with multifocal pneumonia. Procalcitonin 0.010.  Likely due to influenza A. She successfully weaned down to room air.  Patient being discharged home.   Hypertension: Continue home amlodipine, lisinopril.  Discharge Instructions  Discharge Instructions     Call MD for:  difficulty breathing, headache or visual disturbances   Complete by: As directed    Call MD for:  persistant dizziness or light-headedness   Complete by: As directed    Call MD for:  persistant nausea and vomiting   Complete by: As directed    Diet - low sodium heart healthy   Complete by: As directed    Diet Carb Modified   Complete by: As directed    Discharge instructions   Complete by: As directed    Advised to follow-up with primary care physician in 1 week. Advised to take prednisone 50 mg daily for 2 more days to complete 5-day treatment. Advised to take Tamiflu 75 mg twice daily for 3 more days.   Increase activity slowly   Complete by: As directed       Allergies as of 11/27/2023  No Known Allergies      Medication List     TAKE these medications    acetaminophen 500 MG tablet Commonly known as: TYLENOL Take 1,000 mg by mouth 2 (two) times daily as needed for moderate pain (pain score 4-6), fever or headache.   albuterol 108 (90 Base) MCG/ACT inhaler Commonly known as:  VENTOLIN HFA Inhale 2 puffs into the lungs every 6 (six) hours as needed for wheezing or shortness of breath.   amLODipine 10 MG tablet Commonly known as: NORVASC TAKE 1 TABLET BY MOUTH EVERY DAY   benzonatate 100 MG capsule Commonly known as: TESSALON Take 1 capsule (100 mg total) by mouth 3 (three) times daily.   clobetasol ointment 0.05 % Commonly known as: TEMOVATE Apply 1 Application topically 2 (two) times daily. Apply to hands and feet twice daily   guaiFENesin-dextromethorphan 100-10 MG/5ML syrup Commonly known as: ROBITUSSIN DM Take 5 mLs by mouth every 4 (four) hours as needed for cough (chest congestion).   lisinopril 20 MG tablet Commonly known as: ZESTRIL TAKE 1 TABLET BY MOUTH EVERY DAY   oseltamivir 75 MG capsule Commonly known as: TAMIFLU Take 1 capsule (75 mg total) by mouth 2 (two) times daily for 3 days.   predniSONE 50 MG tablet Commonly known as: DELTASONE Take 1 tablet (50 mg total) by mouth daily with breakfast for 2 days.        Follow-up Information     Koirala, Dibas, MD Follow up in 1 week(s).   Specialty: Family Medicine Contact information: [address] [place] [address] [phone]                No Known Allergies  Consultations: None   Procedures/Studies: CT Angio Chest Pulmonary Embolism (PE) W or WO Contrast Result Date: 11/25/2023 CLINICAL DATA:  Pulmonary embolism (PE) suspected, high prob EXAM: CT ANGIOGRAPHY CHEST WITH CONTRAST TECHNIQUE: Multidetector CT imaging of the chest was performed using the standard protocol during bolus administration of intravenous contrast. Multiplanar CT image reconstructions and MIPs were obtained to evaluate the vascular anatomy. RADIATION DOSE REDUCTION: This exam was performed according to the departmental dose-optimization program which includes automated exposure control, adjustment of the mA and/or kV according to patient size and/or use of iterative  reconstruction technique. CONTRAST:  75mL OMNIPAQUE IOHEXOL 350 MG/ML SOLN COMPARISON:  Chest x-ray 11/24/2023 FINDINGS: Cardiovascular: Satisfactory opacification of the pulmonary arteries to the segmental level. No evidence of pulmonary embolism. Thoracic aorta is nonaneurysmal. Minimal aortic atherosclerosis. Normal heart size. No pericardial effusion. Mediastinum/Nodes: Mildly enlarged bilateral hilar lymph nodes measuring 1.4 cm on the right and 1.0 cm on the left (series 5, image 62). No axillary or mediastinal lymphadenopathy. Thyroid gland, trachea, and esophagus within normal limits. Lungs/Pleura: Right middle lobe airspace consolidation. Additional areas of patchy airspace opacity and ground-glass attenuation within both lungs. No pleural effusion or pneumothorax. Upper Abdomen: No acute abnormality. Musculoskeletal: No chest wall abnormality. No acute or significant osseous findings. Review of the MIP images confirms the above findings. IMPRESSION: 1. No evidence of pulmonary embolism. 2. Right middle lobe airspace consolidation with additional areas of patchy airspace opacity and ground-glass attenuation within both lungs. Findings are most compatible with multifocal pneumonia. 3. Mildly enlarged bilateral hilar lymph nodes, likely reactive. 4. Aortic atherosclerosis (ICD10-I70.0). Electronically Signed   By: Duanne Guess D.O.   On: 11/25/2023 15:44   DG CHEST PORT 1 VIEW Result Date: 11/24/2023 CLINICAL DATA:  [id number] Acute respiratory failure with hypoxia (HCC)  [id number] EXAM: PORTABLE CHEST 1 VIEW COMPARISON:  November 24, 2023, Mar 04, 2012 FINDINGS: The cardiomediastinal silhouette is unchanged in contour. No pleural effusion. No pneumothorax. Mildly prominent bronchitic markings with LEFT basilar reticular nodularity. IMPRESSION: Mildly prominent bronchitic markings favored to reflect underlying small airways disease. Electronically Signed   By: Meda Klinefelter M.D.   On: 11/24/2023 18:24    DG Chest 2 View Result Date: 11/24/2023 CLINICAL DATA:  Cough.  Shortness of breath. EXAM: CHEST - 2 VIEW COMPARISON:  03/04/2012. FINDINGS: Bilateral lung fields are clear. Bilateral costophrenic angles are clear. Normal cardio-mediastinal silhouette. No acute osseous abnormalities. The soft tissues are within normal limits. IMPRESSION: No active cardiopulmonary disease. Electronically Signed   By: Jules Schick M.D.   On: 11/24/2023 13:44    Subjective: Patient was seen and examined at bedside.  Overnight events noted.   Patient reports feeling much improved and wants to be discharged home.  Discharge Exam: Vitals:   11/27/23 0744 11/27/23 0845  BP: (!) 143/88 (!) 143/88  Pulse: 89   Resp: 16   Temp: 97.8 F (36.6 C)   SpO2: 94%    Vitals:   11/27/23 0045 11/27/23 0445 11/27/23 0744 11/27/23 0845  BP: (!) 154/84 129/83 (!) 143/88 (!) 143/88  Pulse: 96 75 89   Resp: 17 (!) 23 16   Temp: 98 F (36.7 C) 97.9 F (36.6 C) 97.8 F (36.6 C)   TempSrc: Oral Oral Oral   SpO2: 91% 95% 94%   Weight:      Height:        General: Pt is alert, awake, not in acute distress Cardiovascular: RRR, S1/S2 +, no rubs, no gallops Respiratory: CTA bilaterally, no wheezing, no rhonchi Abdominal: Soft, NT, ND, bowel sounds + Extremities: no edema, no cyanosis    The results of significant diagnostics from this hospitalization (including imaging, microbiology, ancillary and laboratory) are listed below for reference.     Microbiology: Recent Results (from the past 240 hours)  Resp panel by RT-PCR (RSV, Flu A&B, Covid)     Status: Abnormal   Collection Time: 11/24/23  2:15 PM   Specimen: Nasal Swab  Result Value Ref Range Status   SARS Coronavirus 2 by RT PCR NEGATIVE NEGATIVE Final   Influenza A by PCR POSITIVE (A) NEGATIVE Final   Influenza B by PCR NEGATIVE NEGATIVE Final    Comment: (NOTE) The Xpert Xpress SARS-CoV-2/FLU/RSV plus assay is intended as an aid in the diagnosis of  influenza from Nasopharyngeal swab specimens and should not be used as a sole basis for treatment. Nasal washings and aspirates are unacceptable for Xpert Xpress SARS-CoV-2/FLU/RSV testing.  Fact Sheet for Patients: BloggerCourse.com  Fact Sheet for Healthcare Providers: SeriousBroker.it  This test is not yet approved or cleared by the [place] FDA and has been authorized for detection and/or diagnosis of SARS-CoV-2 by FDA under an Emergency Use Authorization (EUA). This EUA will remain in effect (meaning this test can be used) for the duration of the COVID-19 declaration under Section 564(b)(1) of the Act, 21 U.S.C. section 360bbb-3(b)(1), unless the authorization is terminated or revoked.     Resp Syncytial Virus by PCR NEGATIVE NEGATIVE Final    Comment: (NOTE) Fact Sheet for Patients: BloggerCourse.com  Fact Sheet for Healthcare Providers: SeriousBroker.it  This test is not yet approved or cleared by the [place] FDA and has been authorized for detection and/or diagnosis of SARS-CoV-2 by FDA under an Emergency Use Authorization (EUA). This EUA will remain  in effect (meaning this test can be used) for the duration of the COVID-19 declaration under Section 564(b)(1) of the Act, 21 U.S.C. section 360bbb-3(b)(1), unless the authorization is terminated or revoked.  Performed at [facility], [address] [place], [place] [address]   Culture, blood (routine x 2)     Status: None (Preliminary result)   Collection Time: 11/24/23  2:15 PM   Specimen: BLOOD RIGHT FOREARM  Result Value Ref Range Status   Specimen Description BLOOD RIGHT FOREARM  Final   Special Requests   Final    BOTTLES DRAWN AEROBIC AND ANAEROBIC Blood Culture results may not be optimal due to an inadequate volume of blood received in culture bottles   Culture   Final    NO GROWTH 3  DAYS Performed at [facility], [address], [place], [place] [address]    Report Status PENDING  Incomplete  Culture, blood (routine x 2)     Status: None (Preliminary result)   Collection Time: 11/24/23  2:20 PM   Specimen: BLOOD RIGHT HAND  Result Value Ref Range Status   Specimen Description BLOOD RIGHT HAND  Final   Special Requests   Final    BOTTLES DRAWN AEROBIC AND ANAEROBIC Blood Culture results may not be optimal due to an inadequate volume of blood received in culture bottles   Culture   Final    NO GROWTH 3 DAYS Performed at [facility], [address], [place], [place] [address]    Report Status PENDING  Incomplete  MRSA Next Gen by PCR, Nasal     Status: None   Collection Time: 11/24/23  9:44 PM   Specimen: Nasal Mucosa; Nasal Swab  Result Value Ref Range Status   MRSA by PCR Next Gen NOT DETECTED NOT DETECTED Final    Comment: (NOTE) The GeneXpert MRSA Assay (FDA approved for NASAL specimens only), is one component of a comprehensive MRSA colonization surveillance program. It is not intended to diagnose MRSA infection nor to guide or monitor treatment for MRSA infections. Test performance is not FDA approved in patients less than 49 years old. Performed at [facility], [address] [place], [place] [address]      Labs: BNP (last 3 results) Recent Labs    11/24/23 1415  BNP 19.8   Basic Metabolic Panel: Recent Labs  Lab 11/24/23 1441 11/25/23 0210  NA 134* 138  K 3.5 3.7  CL 99 97*  CO2 21* 26  GLUCOSE 105* 108*  BUN 14 15  CREATININE 0.63 0.90  CALCIUM 9.5 9.7   Liver Function Tests: Recent Labs  Lab 11/25/23 0210  AST 24  ALT 25  ALKPHOS 39  BILITOT 0.9  PROT 7.7  ALBUMIN 3.9   No results for input(s): "LIPASE", "AMYLASE" in the last 168 hours. No results for input(s): "AMMONIA" in the last 168 hours. CBC: Recent Labs  Lab 11/24/23 1441 11/25/23 0210  WBC 9.7 7.1  NEUTROABS 8.7*  --   HGB 16.3* 16.6*   HCT 47.7* 48.7*  MCV 84.7 84.8  PLT 249 237   Cardiac Enzymes: No results for input(s): "CKTOTAL", "CKMB", "CKMBINDEX", "TROPONINI" in the last 168 hours. BNP: Invalid input(s): "POCBNP" CBG: No results for input(s): "GLUCAP" in the last 168 hours. D-Dimer Recent Labs    11/24/23 1441  DDIMER <0.27   Hgb A1c No results for input(s): "HGBA1C" in the last 72 hours. Lipid Profile No results for input(s): "CHOL", "HDL", "  LDLCALC", "TRIG", "CHOLHDL", "LDLDIRECT" in the last 72 hours. Thyroid function studies No results for input(s): "TSH", "T4TOTAL", "T3FREE", "THYROIDAB" in the last 72 hours.  Invalid input(s): "FREET3" Anemia work up No results for input(s): "VITAMINB12", "FOLATE", "FERRITIN", "TIBC", "IRON", "RETICCTPCT" in the last 72 hours. Urinalysis    Component Value Date/Time   COLORURINE RED (A) 11/30/2015 0106   APPEARANCEUR CLOUDY (A) 11/30/2015 0106   LABSPEC 1.031 (H) 11/30/2015 0106   PHURINE 5.5 11/30/2015 0106   GLUCOSEU NEGATIVE 11/30/2015 0106   HGBUR LARGE (A) 11/30/2015 0106   BILIRUBINUR MODERATE (A) 11/30/2015 0106   KETONESUR NEGATIVE 11/30/2015 0106   PROTEINUR 100 (A) 11/30/2015 0106   UROBILINOGEN 1.0 03/01/2013 0850   NITRITE NEGATIVE 11/30/2015 0106   LEUKOCYTESUR SMALL (A) 11/30/2015 0106   Sepsis Labs Recent Labs  Lab 11/24/23 1441 11/25/23 0210  WBC 9.7 7.1   Microbiology Recent Results (from the past 240 hours)  Resp panel by RT-PCR (RSV, Flu A&B, Covid)     Status: Abnormal   Collection Time: 11/24/23  2:15 PM   Specimen: Nasal Swab  Result Value Ref Range Status   SARS Coronavirus 2 by RT PCR NEGATIVE NEGATIVE Final   Influenza A by PCR POSITIVE (A) NEGATIVE Final   Influenza B by PCR NEGATIVE NEGATIVE Final    Comment: (NOTE) The Xpert Xpress SARS-CoV-2/FLU/RSV plus assay is intended as an aid in the diagnosis of influenza from Nasopharyngeal swab specimens and should not be used as a sole basis for treatment. Nasal  washings and aspirates are unacceptable for Xpert Xpress SARS-CoV-2/FLU/RSV testing.  Fact Sheet for Patients: BloggerCourse.com  Fact Sheet for Healthcare Providers: SeriousBroker.it  This test is not yet approved or cleared by the [place] FDA and has been authorized for detection and/or diagnosis of SARS-CoV-2 by FDA under an Emergency Use Authorization (EUA). This EUA will remain in effect (meaning this test can be used) for the duration of the COVID-19 declaration under Section 564(b)(1) of the Act, 21 U.S.C. section 360bbb-3(b)(1), unless the authorization is terminated or revoked.     Resp Syncytial Virus by PCR NEGATIVE NEGATIVE Final    Comment: (NOTE) Fact Sheet for Patients: BloggerCourse.com  Fact Sheet for Healthcare Providers: SeriousBroker.it  This test is not yet approved or cleared by the [place] FDA and has been authorized for detection and/or diagnosis of SARS-CoV-2 by FDA under an Emergency Use Authorization (EUA). This EUA will remain in effect (meaning this test can be used) for the duration of the COVID-19 declaration under Section 564(b)(1) of the Act, 21 U.S.C. section 360bbb-3(b)(1), unless the authorization is terminated or revoked.  Performed at [facility], [address], [place], [place] [address]   Culture, blood (routine x 2)     Status: None (Preliminary result)   Collection Time: 11/24/23  2:15 PM   Specimen: BLOOD RIGHT FOREARM  Result Value Ref Range Status   Specimen Description BLOOD RIGHT FOREARM  Final   Special Requests   Final    BOTTLES DRAWN AEROBIC AND ANAEROBIC Blood Culture results may not be optimal due to an inadequate volume of blood received in culture bottles   Culture   Final    NO GROWTH 3 DAYS Performed at [facility], [address] [place], [place] [address]    Report Status PENDING   Incomplete  Culture, blood (routine x 2)     Status: None (Preliminary result)   Collection Time: 11/24/23  2:20 PM   Specimen:  BLOOD RIGHT HAND  Result Value Ref Range Status   Specimen Description BLOOD RIGHT HAND  Final   Special Requests   Final    BOTTLES DRAWN AEROBIC AND ANAEROBIC Blood Culture results may not be optimal due to an inadequate volume of blood received in culture bottles   Culture   Final    NO GROWTH 3 DAYS Performed at [facility], [address] [place], [place] [address]    Report Status PENDING  Incomplete  MRSA Next Gen by PCR, Nasal     Status: None   Collection Time: 11/24/23  9:44 PM   Specimen: Nasal Mucosa; Nasal Swab  Result Value Ref Range Status   MRSA by PCR Next Gen NOT DETECTED NOT DETECTED Final    Comment: (NOTE) The GeneXpert MRSA Assay (FDA approved for NASAL specimens only), is one component of a comprehensive MRSA colonization surveillance program. It is not intended to diagnose MRSA infection nor to guide or monitor treatment for MRSA infections. Test performance is not FDA approved in patients less than 18 years old. Performed at [facility], [address], [place], [place] [address]      Time coordinating discharge: Over 30 minutes  SIGNED:   Willeen Niece, MD  [facility] 11/27/2023, 11:06 AM Pager   If 7PM-7AM, please contact night-coverage

## 2023-11-27 MED ORDER — TRAMADOL HCL 50 MG PO TABS
50.0000 mg | ORAL_TABLET | Freq: Once | ORAL | Status: AC
  Administered 2023-11-27: 50 mg via ORAL
  Filled 2023-11-27: qty 1

## 2023-11-27 MED ORDER — ALBUTEROL SULFATE HFA 108 (90 BASE) MCG/ACT IN AERS: 2.0000 | INHALATION_SPRAY | Freq: Four times a day (QID) | RESPIRATORY_TRACT | 2 refills | Status: AC | PRN

## 2023-11-27 NOTE — Plan of Care (Signed)

## 2023-11-27 NOTE — Progress Notes (Signed)
SATURATION QUALIFICATIONS: (This note is used to comply with regulatory documentation for home oxygen) ? ?Patient Saturations on Room Air at Rest = 97% ? ?Patient Saturations on Room Air while Ambulating = 98% ? ? ?

## 2023-11-27 NOTE — Plan of Care (Signed)

## 2023-11-27 NOTE — TOC Transition Note (Signed)
 Transition of Care [facility]) - Discharge Note   Patient Details  Name: Kara Ramsey MRN: [id number] Date of Birth: March 23, 1971  Transition of Care [facility]) CM/SW Contact:  Tom-Johnson, Hershal Coria, RN Phone Number: 11/27/2023, 9:42 AM   Clinical Narrative:     Patient is scheduled for discharge today.  Readmission Risk Assessment done. Outpatient f/u, hospital f/u and discharge instructions on AVS. No TOC needs or recommendations noted. Husband, Alysia Penna to transport at discharge.  No further TOC needs noted.           Final next level of care: Home/Self Care Barriers to Discharge: Barriers Resolved   Patient Goals and CMS Choice Patient states their goals for this hospitalization and ongoing recovery are:: To return home CMS Medicare.gov Compare Post Acute Care list provided to:: Patient Choice offered to / list presented to : NA      Discharge Placement                Patient to be transferred to facility by: Spouse Name of family member notified: Ervin    Discharge Plan and Services Additional resources added to the After Visit Summary for                  DME Arranged: N/A DME Agency: NA       HH Arranged: NA HH Agency: NA        Social Drivers of Health (SDOH) Interventions SDOH Screenings   Food Insecurity: No Food Insecurity (11/25/2023)  Housing: Low Risk  (11/25/2023)  Transportation Needs: No Transportation Needs (11/25/2023)  Utilities: Not At Risk (11/25/2023)  Depression (PHQ2-9): Low Risk  (11/16/2021)  Tobacco Use: High Risk (11/25/2023)     Readmission Risk Interventions    11/27/2023    9:41 AM  Readmission Risk Prevention Plan  Post Dischage Appt Complete  Medication Screening Complete  Transportation Screening Complete

## 2023-11-29 LAB — CULTURE, BLOOD (ROUTINE X 2)
Culture: NO GROWTH
Culture: NO GROWTH

## 2023-12-26 ENCOUNTER — Inpatient Hospital Stay: Payer: Self-pay | Admitting: Internal Medicine
